# Patient Record
Sex: Female | Born: 1950 | Race: Black or African American | Hispanic: No | Smoking: Never smoker
Health system: Southern US, Community
[De-identification: ages and names within clinical notes are randomized; demographics above are authoritative.]

---

## 1997-12-20 ENCOUNTER — Other Ambulatory Visit: Admission: RE | Admit: 1997-12-20 | Discharge: 1997-12-20 | Payer: Self-pay | Admitting: Obstetrics and Gynecology

## 1999-09-16 ENCOUNTER — Other Ambulatory Visit: Admission: RE | Admit: 1999-09-16 | Discharge: 1999-09-16 | Payer: Self-pay | Admitting: Obstetrics and Gynecology

## 1999-09-16 ENCOUNTER — Encounter: Payer: Self-pay | Admitting: Obstetrics and Gynecology

## 1999-09-16 ENCOUNTER — Ambulatory Visit (HOSPITAL_COMMUNITY): Admission: RE | Admit: 1999-09-16 | Discharge: 1999-09-16 | Payer: Self-pay | Admitting: Obstetrics and Gynecology

## 2000-04-03 ENCOUNTER — Emergency Department (HOSPITAL_COMMUNITY): Admission: EM | Admit: 2000-04-03 | Discharge: 2000-04-03 | Payer: Self-pay | Admitting: Emergency Medicine

## 2001-04-20 ENCOUNTER — Encounter: Payer: Self-pay | Admitting: Obstetrics and Gynecology

## 2001-04-20 ENCOUNTER — Ambulatory Visit (HOSPITAL_COMMUNITY): Admission: RE | Admit: 2001-04-20 | Discharge: 2001-04-20 | Payer: Self-pay | Admitting: Obstetrics and Gynecology

## 2004-08-05 ENCOUNTER — Other Ambulatory Visit: Admission: RE | Admit: 2004-08-05 | Discharge: 2004-08-05 | Payer: Self-pay | Admitting: Obstetrics and Gynecology

## 2009-06-12 ENCOUNTER — Ambulatory Visit (HOSPITAL_COMMUNITY): Admission: RE | Admit: 2009-06-12 | Discharge: 2009-06-12 | Payer: Self-pay | Admitting: Obstetrics and Gynecology

## 2020-04-20 ENCOUNTER — Encounter (HOSPITAL_COMMUNITY): Payer: Self-pay | Admitting: Student

## 2020-04-20 ENCOUNTER — Emergency Department (HOSPITAL_COMMUNITY): Payer: Medicare HMO

## 2020-04-20 ENCOUNTER — Inpatient Hospital Stay (HOSPITAL_COMMUNITY)
Admission: EM | Admit: 2020-04-20 | Discharge: 2020-04-25 | DRG: 177 | Disposition: A | Discharge status: Skilled Nursing Facility | Payer: Medicare HMO | Attending: Internal Medicine | Admitting: Internal Medicine

## 2020-04-20 ENCOUNTER — Other Ambulatory Visit: Payer: Self-pay

## 2020-04-20 DIAGNOSIS — R748 Abnormal levels of other serum enzymes: Secondary | ICD-10-CM

## 2020-04-20 DIAGNOSIS — Z789 Other specified health status: Secondary | ICD-10-CM

## 2020-04-20 DIAGNOSIS — N179 Acute kidney failure, unspecified: Secondary | ICD-10-CM | POA: Diagnosis present

## 2020-04-20 DIAGNOSIS — I10 Essential (primary) hypertension: Secondary | ICD-10-CM | POA: Diagnosis present

## 2020-04-20 DIAGNOSIS — U071 COVID-19: Secondary | ICD-10-CM | POA: Diagnosis present

## 2020-04-20 DIAGNOSIS — J9601 Acute respiratory failure with hypoxia: Secondary | ICD-10-CM | POA: Diagnosis present

## 2020-04-20 DIAGNOSIS — I160 Hypertensive urgency: Secondary | ICD-10-CM | POA: Diagnosis present

## 2020-04-20 DIAGNOSIS — J1282 Pneumonia due to coronavirus disease 2019: Secondary | ICD-10-CM | POA: Diagnosis present

## 2020-04-20 DIAGNOSIS — R7989 Other specified abnormal findings of blood chemistry: Secondary | ICD-10-CM | POA: Diagnosis present

## 2020-04-20 LAB — COMPREHENSIVE METABOLIC PANEL
ALT: 123 U/L — ABNORMAL HIGH (ref 0–44)
AST: 102 U/L — ABNORMAL HIGH (ref 15–41)
Albumin: 3.4 g/dL — ABNORMAL LOW (ref 3.5–5.0)
Alkaline Phosphatase: 80 U/L (ref 38–126)
Anion gap: 12 (ref 5–15)
BUN: 23 mg/dL (ref 8–23)
CO2: 26 mmol/L (ref 22–32)
Calcium: 9 mg/dL (ref 8.9–10.3)
Chloride: 103 mmol/L (ref 98–111)
Creatinine, Ser: 0.91 mg/dL (ref 0.44–1.00)
GFR calc Af Amer: >60 mL/min (ref >60)
GFR calc non Af Amer: >60 mL/min (ref >60)
Glucose, Bld: 118 mg/dL — ABNORMAL HIGH (ref 70–99)
Potassium: 3.7 mmol/L (ref 3.5–5.1)
Sodium: 141 mmol/L (ref 135–145)
Total Bilirubin: 0.9 mg/dL (ref 0.3–1.2)
Total Protein: 7.9 g/dL (ref 6.5–8.1)

## 2020-04-20 LAB — CBC WITH DIFFERENTIAL/PLATELET
Abs Immature Granulocytes: 0.2 10*3/uL — ABNORMAL HIGH (ref 0.00–0.07)
Basophils Absolute: 0 10*3/uL (ref 0.0–0.1)
Basophils Relative: 0 %
Eosinophils Absolute: 0 10*3/uL (ref 0.0–0.5)
Eosinophils Relative: 0 %
HCT: 43.6 % (ref 36.0–46.0)
Hemoglobin: 13.9 g/dL (ref 12.0–15.0)
Immature Granulocytes: 2 %
Lymphocytes Relative: 7 %
Lymphs Abs: 0.7 10*3/uL (ref 0.7–4.0)
MCH: 30.3 pg (ref 26.0–34.0)
MCHC: 31.9 g/dL (ref 30.0–36.0)
MCV: 95.2 fL (ref 80.0–100.0)
Monocytes Absolute: 0.4 10*3/uL (ref 0.1–1.0)
Monocytes Relative: 4 %
Neutro Abs: 8.1 10*3/uL — ABNORMAL HIGH (ref 1.7–7.7)
Neutrophils Relative %: 87 %
Platelets: 224 10*3/uL (ref 150–400)
RBC: 4.58 MIL/uL (ref 3.87–5.11)
RDW: 13.9 % (ref 11.5–15.5)
WBC: 9.4 10*3/uL (ref 4.0–10.5)
nRBC: 0 % (ref 0.0–0.2)

## 2020-04-20 LAB — CREATININE, SERUM
Creatinine, Ser: 0.77 mg/dL (ref 0.44–1.00)
GFR calc Af Amer: >60 mL/min (ref >60)
GFR calc non Af Amer: >60 mL/min (ref >60)

## 2020-04-20 LAB — TRIGLYCERIDES: Triglycerides: 77 mg/dL (ref <150)

## 2020-04-20 LAB — ABO/RH: ABO/RH(D): A POS

## 2020-04-20 LAB — C-REACTIVE PROTEIN: CRP: 16.2 mg/dL — ABNORMAL HIGH (ref <1.0)

## 2020-04-20 LAB — SARS CORONAVIRUS 2 BY RT PCR (HOSPITAL ORDER, PERFORMED IN ~~LOC~~ HOSPITAL LAB): SARS Coronavirus 2: POSITIVE — AB

## 2020-04-20 LAB — D-DIMER, QUANTITATIVE: D-Dimer, Quant: 2.28 ug/mL-FEU — ABNORMAL HIGH (ref 0.00–0.50)

## 2020-04-20 LAB — PROCALCITONIN: Procalcitonin: <0.1 ng/mL

## 2020-04-20 LAB — LACTATE DEHYDROGENASE: LDH: 310 U/L — ABNORMAL HIGH (ref 98–192)

## 2020-04-20 LAB — LACTIC ACID, PLASMA: Lactic Acid, Venous: 1.3 mmol/L (ref 0.5–1.9)

## 2020-04-20 LAB — FERRITIN: Ferritin: 691 ng/mL — ABNORMAL HIGH (ref 11–307)

## 2020-04-20 LAB — FIBRINOGEN: Fibrinogen: >800 mg/dL — ABNORMAL HIGH (ref 210–475)

## 2020-04-20 LAB — HIV ANTIBODY (ROUTINE TESTING W REFLEX): HIV Screen 4th Generation wRfx: NONREACTIVE

## 2020-04-20 MED ORDER — HYDRALAZINE HCL 20 MG/ML IJ SOLN
5.0000 mg | INTRAMUSCULAR | Status: DC | PRN
  Administered 2020-04-21: 5 mg via INTRAVENOUS
  Filled 2020-04-20 (×2): qty 1

## 2020-04-20 MED ORDER — SODIUM CHLORIDE 0.9 % IV SOLN: 100.0000 mg | Freq: Every day | INTRAVENOUS | Status: DC

## 2020-04-20 MED ORDER — SODIUM CHLORIDE 0.9 % IV SOLN
100.0000 mg | Freq: Every day | INTRAVENOUS | Status: AC
  Administered 2020-04-21 – 2020-04-24 (×4): 100 mg via INTRAVENOUS
  Filled 2020-04-20 (×4): qty 20

## 2020-04-20 MED ORDER — DEXAMETHASONE SODIUM PHOSPHATE 10 MG/ML IJ SOLN
6.0000 mg | Freq: Once | INTRAMUSCULAR | Status: AC
  Administered 2020-04-20: 6 mg via INTRAVENOUS
  Filled 2020-04-20: qty 1

## 2020-04-20 MED ORDER — SODIUM CHLORIDE 0.9 % IV SOLN
200.0000 mg | Freq: Once | INTRAVENOUS | Status: DC
  Filled 2020-04-20: qty 40

## 2020-04-20 MED ORDER — METOPROLOL TARTRATE 25 MG PO TABS: 25.0000 mg | ORAL_TABLET | Freq: Two times a day (BID) | ORAL | Status: DC

## 2020-04-20 MED ORDER — ALBUTEROL SULFATE HFA 108 (90 BASE) MCG/ACT IN AERS
2.0000 | INHALATION_SPRAY | Freq: Four times a day (QID) | RESPIRATORY_TRACT | Status: DC
  Administered 2020-04-20 – 2020-04-25 (×20): 2 via RESPIRATORY_TRACT
  Filled 2020-04-20 (×2): qty 6.7

## 2020-04-20 MED ORDER — GUAIFENESIN-DM 100-10 MG/5ML PO SYRP
10.0000 mL | ORAL_SOLUTION | ORAL | Status: DC | PRN
  Administered 2020-04-21 – 2020-04-25 (×7): 10 mL via ORAL
  Filled 2020-04-20 (×7): qty 10

## 2020-04-20 MED ORDER — DEXAMETHASONE SODIUM PHOSPHATE 10 MG/ML IJ SOLN
6.0000 mg | Freq: Every day | INTRAMUSCULAR | Status: DC
  Filled 2020-04-20: qty 1

## 2020-04-20 MED ORDER — METOPROLOL TARTRATE 25 MG PO TABS
25.0000 mg | ORAL_TABLET | Freq: Two times a day (BID) | ORAL | Status: DC
  Administered 2020-04-20: 25 mg via ORAL
  Filled 2020-04-20 (×2): qty 1

## 2020-04-20 MED ORDER — ACETAMINOPHEN 650 MG RE SUPP: 650.0000 mg | Freq: Four times a day (QID) | RECTAL | Status: DC | PRN

## 2020-04-20 MED ORDER — ACETAMINOPHEN 325 MG PO TABS
650.0000 mg | ORAL_TABLET | Freq: Four times a day (QID) | ORAL | Status: DC | PRN
  Administered 2020-04-22: 650 mg via ORAL
  Filled 2020-04-20: qty 2

## 2020-04-20 MED ORDER — HYDROCOD POLST-CPM POLST ER 10-8 MG/5ML PO SUER
5.0000 mL | Freq: Two times a day (BID) | ORAL | Status: DC | PRN
  Administered 2020-04-22 – 2020-04-24 (×3): 5 mL via ORAL
  Filled 2020-04-20 (×3): qty 5

## 2020-04-20 MED ORDER — SODIUM CHLORIDE 0.9 % IV SOLN
200.0000 mg | Freq: Once | INTRAVENOUS | Status: AC
  Administered 2020-04-20: 200 mg via INTRAVENOUS
  Filled 2020-04-20: qty 200

## 2020-04-20 MED ORDER — LISINOPRIL 10 MG PO TABS
5.0000 mg | ORAL_TABLET | Freq: Every day | ORAL | Status: DC
  Administered 2020-04-20: 5 mg via ORAL
  Filled 2020-04-20: qty 1

## 2020-04-20 MED ORDER — ENOXAPARIN SODIUM 40 MG/0.4ML ~~LOC~~ SOLN
40.0000 mg | SUBCUTANEOUS | Status: DC
  Administered 2020-04-20 – 2020-04-24 (×5): 40 mg via SUBCUTANEOUS
  Filled 2020-04-20 (×5): qty 0.4

## 2020-04-20 MED ORDER — ASCORBIC ACID 500 MG PO TABS
500.0000 mg | ORAL_TABLET | Freq: Every day | ORAL | Status: DC
  Administered 2020-04-20 – 2020-04-25 (×6): 500 mg via ORAL
  Filled 2020-04-20 (×5): qty 1

## 2020-04-20 MED ORDER — ZINC SULFATE 220 (50 ZN) MG PO CAPS
220.0000 mg | ORAL_CAPSULE | Freq: Every day | ORAL | Status: DC
  Administered 2020-04-20 – 2020-04-25 (×6): 220 mg via ORAL
  Filled 2020-04-20 (×6): qty 1

## 2020-04-20 NOTE — ED Notes (Signed)
Called main lab to confirm they could run admission lab orders off blood specimens already in lab. Confirmed they could. Just need to send down pink top tube.

## 2020-04-20 NOTE — ED Provider Notes (Signed)
[facility]-EMERGENCY DEPT Provider Note   CSN: [id number] Arrival date & time: 04/20/20  0827     History Chief Complaint  Patient presents with  . Shortness of Breath    Molly Grant is a 69 y.o. female without significant past medical history who presents to the ED with complaints of dyspnea x 1 week. Patient reports dyspnea is constant, worse with activity, no alleviating factors. Has associated dry cough which has become productive of clear sputum & fatigue. Worsening dyspnea prompted ED visit. Denies fever, chills, chest pain, leg pain/swelling, hemoptysis, recent surgery/trauma, recent long travel, hormone use, personal hx of cancer, or hx of DVT/PE. She has not been vaccinated against COVID 19. Works from home. No recent known COVID exposures. Per EMS hypoxic in the 80s on RA.    HPI     History reviewed. No pertinent past medical history.  There are no problems to display for this patient.   History reviewed. No pertinent surgical history.   OB History   No obstetric history on file.     History reviewed. No pertinent family history.  Social History   Tobacco Use  . Smoking status: Never Smoker  . Smokeless tobacco: Never Used  Substance Use Topics  . Alcohol use: Not on file  . Drug use: Not on file    Home Medications Prior to Admission medications   Not on File    Allergies    Patient has no known allergies.  Review of Systems   Review of Systems  Constitutional: Positive for fatigue. Negative for chills and fever.  HENT: Negative for congestion, ear pain and sore throat.   Respiratory: Positive for cough and shortness of breath. Negative for wheezing.   Cardiovascular: Negative for chest pain.  Gastrointestinal: Negative for abdominal pain, diarrhea, nausea and vomiting.  Neurological: Negative for syncope.  All other systems reviewed and are negative.  Physical Exam Updated Vital Signs BP (!) 189/105 (BP Location:  Right Arm)   Pulse (!) 102   Temp 98.4 F (36.9 C) (Oral)   Resp (!) 33   Ht 5\' 2"  (1.575 m)   SpO2 97%   Physical Exam Vitals and nursing note reviewed.  Constitutional:      General: She is not in acute distress.    Appearance: She is well-developed. She is ill-appearing. She is not toxic-appearing.  HENT:     Head: Normocephalic and atraumatic.  Eyes:     General:        Right eye: No discharge.        Left eye: No discharge.     Conjunctiva/sclera: Conjunctivae normal.  Cardiovascular:     Rate and Rhythm: Normal rate and regular rhythm.  Pulmonary:     Effort: Tachypnea present. No respiratory distress.     Breath sounds: No wheezing, rhonchi or rales.     Comments: Mildly tachypneic. SpO2 in the upper 80s on RA, improved with application of 2L via [place].  Abdominal:     General: There is no distension.     Palpations: Abdomen is soft.     Tenderness: There is no abdominal tenderness.  Musculoskeletal:     Cervical back: Neck supple.     Comments: Trace symmetric pitting edema to the bilateral lower legs. No calf tenderness or overlying erythema.   Skin:    General: Skin is warm and dry.     Findings: No rash.  Neurological:     Mental Status: She is alert.  Comments: Clear speech.   Psychiatric:        Behavior: Behavior normal.     ED Results / Procedures / Treatments   Labs (all labs ordered are listed, but only abnormal results are displayed) Labs Reviewed  SARS CORONAVIRUS 2 BY RT PCR (HOSPITAL ORDER, PERFORMED IN [facility]) - Abnormal; Notable for the following components:      Result Value   SARS Coronavirus 2 POSITIVE (*)    All other components within normal limits  CBC WITH DIFFERENTIAL/PLATELET - Abnormal; Notable for the following components:   Neutro Abs 8.1 (*)    Abs Immature Granulocytes 0.20 (*)    All other components within normal limits  COMPREHENSIVE METABOLIC PANEL - Abnormal; Notable for the following components:    Glucose, Bld 118 (*)    Albumin 3.4 (*)    AST 102 (*)    ALT 123 (*)    All other components within normal limits  CULTURE, BLOOD (ROUTINE X 2)  CULTURE, BLOOD (ROUTINE X 2)  LACTIC ACID, PLASMA  LACTIC ACID, PLASMA  D-DIMER, QUANTITATIVE (NOT AT [facility])  PROCALCITONIN  LACTATE DEHYDROGENASE  FERRITIN  TRIGLYCERIDES  FIBRINOGEN  C-REACTIVE PROTEIN    EKG None  Radiology DG Chest Portable 1 View  Result Date: 04/20/2020 CLINICAL DATA:  Shortness of breath.  Cough. EXAM: PORTABLE CHEST 1 VIEW COMPARISON:  None. FINDINGS: Bibasilar infiltrates are identified. The heart, hila, mediastinum, lungs, and pleura are otherwise unremarkable. IMPRESSION: Bibasilar infiltrates concerning for multifocal pneumonia. Recommend clinical correlation and follow-up to resolution. Electronically Signed   By: Gerome Sam III M.D   On: 04/20/2020 11:22    Procedures .Critical Care Performed by: Cherly Anderson, PA-C Authorized by: Cherly Anderson, PA-C     CRITICAL CARE Performed by: Harvie Heck   Total critical care time: 35 minutes  Critical care time was exclusive of separately billable procedures and treating other patients.  Critical care was necessary to treat or prevent imminent or life-threatening deterioration.  Critical care was time spent personally by me on the following activities: development of treatment plan with patient and/or surrogate as well as nursing, discussions with consultants, evaluation of patient's response to treatment, examination of patient, obtaining history from patient or surrogate, ordering and performing treatments and interventions, ordering and review of laboratory studies, ordering and review of radiographic studies, pulse oximetry and re-evaluation of patient's condition.    (including critical care time)  Medications Ordered in ED Medications  remdesivir 200 mg in sodium chloride 0.9% 250 mL IVPB (200 mg Intravenous New  Bag/Given 04/20/20 1329)    Followed by  remdesivir 100 mg in sodium chloride 0.9 % 100 mL IVPB (has no administration in time range)  dexamethasone (DECADRON) injection 6 mg (6 mg Intravenous Given 04/20/20 1325)    ED Course  I have reviewed the triage vital signs and the nursing notes.  Pertinent labs & imaging results that were available during my care of the patient were reviewed by me and considered in my medical decision making (see chart for details).    Cam Harnden Thornhill was evaluated in Emergency Department on 04/20/2020 for the symptoms described in the history of present illness. He/she was evaluated in the context of the global COVID-19 pandemic, which necessitated consideration that the patient might be at risk for infection with the SARS-CoV-2 virus that causes COVID-19. Institutional protocols and algorithms that pertain to the evaluation of patients at risk for COVID-19 are in a state of  rapid change based on information released by regulatory bodies including the CDC and federal and state organizations. These policies and algorithms were followed during the patient's care in the ED.  MDM Rules/Calculators/A&P                          Patient presents to the ED with complaints of dyspnea x 1 week. Patient is nontoxic, does appear somewhat ill, noted to be hypertensive & hypoxic.   Ddx: CAP, COVID 19, PE, CHF, bronchitis, arrhythmia, critical anemia.   Additional history obtained:  Additional history obtained from EMS.  EKG: Sinus tachycardia, no STEMI. Lab Tests:  I Ordered, reviewed, and interpreted labs, which included:  CBC: Unremarkable CMP: Transaminitis, otherwise unremarkable. Covid testing: Positive--> Inflammatory markers ordered. Imaging Studies ordered:  I ordered imaging studies which included chest x-ray, I independently visualized and interpreted imaging which showed multifocal pneumonia.  Patient with acute hypoxic respiratory failure in the setting of  COVID-19.  We will start Decadron and remdesivir with consult placed to the hospitalist service.  13:40: CONSULT: Discussed case with hospitalist Dr Rhona Leavens- accepts admission.   Findings and plan of care discussed with supervising physician Dr. Dalene Seltzer who is in agreement.   Portions of this note were generated with Scientist, clinical (histocompatibility and immunogenetics). Dictation errors may occur despite best attempts at proofreading.  Final Clinical Impression(s) / ED Diagnoses Final diagnoses:  COVID-19  Acute hypoxemic respiratory failure [facility])    Rx / DC Orders ED Discharge Orders    None       Cherly Anderson, PA-C 04/20/20 1342    Alvira Monday, MD 04/22/20 1039

## 2020-04-20 NOTE — H&P (Signed)
History and Physical    Molly Grant JQB:[id number] DOB: March 21, 1951 DOA: 04/20/2020  PCP: Patient, No Pcp Per  Patient coming from: Home  Chief Complaint: Shortness of breath  HPI: Molly Grant is a 69 y.o. female with no known significant past medical history who presents with complaints of shortness of breath with cough for 1 week prior to this hospital visit.  Patient denies fevers chills.  Patient denies any known sick contacts.  Given worsening shortness of breath, EMS was called.  Upon EMS arrival, patient was noted to have O2 saturation in the 80s on room air.  Patient is not vaccinated against COVID-19  ED Course: In the emergency department, patient was found to have bilateral infiltrates on chest x-ray, reviewed.  Covid test was confirmed positive.  Inflammatory markers were later noted to be elevated, with CRP elevated at over 16.  Procalcitonin less than 0.1.  LDH 310.  Patient was given 1 dose of IV steroid and started on remdesivir.  Hospitalist service consulted for consideration for medical admission  Review of Systems:  Review of Systems  Constitutional: Positive for fever and malaise/fatigue.  HENT: Positive for congestion. Negative for ear pain.   Eyes: Negative for double vision and pain.  Respiratory: Positive for cough, sputum production and shortness of breath.   Cardiovascular: Negative for palpitations, orthopnea and claudication.  Gastrointestinal: Negative for abdominal pain and constipation.  Genitourinary: Negative for frequency and urgency.  Musculoskeletal: Negative for back pain, joint pain and neck pain.  Neurological: Negative for sensory change, speech change, seizures and loss of consciousness.  Psychiatric/Behavioral: Negative for hallucinations and memory loss. The patient is not nervous/anxious.     History reviewed. No pertinent past medical history.  History reviewed. No pertinent surgical history.   reports that she has never smoked. She  has never used smokeless tobacco. No history on file for alcohol use and drug use.  No Known Allergies  Family history When asked directly, patient denies any family medical problems  Prior to Admission medications   Not on File    Physical Exam: Vitals:   04/20/20 1200 04/20/20 1230 04/20/20 1300 04/20/20 1330  BP: (!) 175/100 (!) 172/98 (!) 174/93 (!) 182/100  Pulse: 90 92 77 90  Resp: (!) 25 (!) 29 (!) 26 (!) 24  Temp:      TempSrc:      SpO2: 92% 93% 95% 95%  Height:        Constitutional: NAD, calm, comfortable Vitals:   04/20/20 1200 04/20/20 1230 04/20/20 1300 04/20/20 1330  BP: (!) 175/100 (!) 172/98 (!) 174/93 (!) 182/100  Pulse: 90 92 77 90  Resp: (!) 25 (!) 29 (!) 26 (!) 24  Temp:      TempSrc:      SpO2: 92% 93% 95% 95%  Height:       Eyes: PERRL, lids and conjunctivae normal ENMT: Mucous membranes are moist. Posterior pharynx clear of any exudate or lesions.Normal dentition.  Neck: normal, supple, no masses, no thyromegaly Respiratory: clear to auscultation bilaterally, no wheezing, no crackles. Normal respiratory effort. No accessory muscle use.  Cardiovascular: Regular rate and rhythm,s1, s2 Abdomen: no tenderness, no masses palpated. No hepatosplenomegaly. Bowel sounds positive.  Musculoskeletal: no clubbing / cyanosis. No joint deformity upper and lower extremities. Good ROM, no contractures. Normal muscle tone.  Skin: no rashes, lesions, ulcers. No induration Neurologic: CN 2-12 grossly intact. Sensation intact, no tremors Psychiatric: Normal judgment and insight. Alert and oriented x 3. Normal  mood.    Labs on Admission: I have personally reviewed following labs and imaging studies  CBC: Recent Labs  Lab 04/20/20 1015  WBC 9.4  NEUTROABS 8.1*  HGB 13.9  HCT 43.6  MCV 95.2  PLT 224   Basic Metabolic Panel: Recent Labs  Lab 04/20/20 1015  NA 141  K 3.7  CL 103  CO2 26  GLUCOSE 118*  BUN 23  CREATININE 0.91  CALCIUM 9.0    GFR: CrCl cannot be calculated (Unknown ideal weight.). Liver Function Tests: Recent Labs  Lab 04/20/20 1015  AST 102*  ALT 123*  ALKPHOS 80  BILITOT 0.9  PROT 7.9  ALBUMIN 3.4*   No results for input(s): LIPASE, AMYLASE in the last 168 hours. No results for input(s): AMMONIA in the last 168 hours. Coagulation Profile: No results for input(s): INR, PROTIME in the last 168 hours. Cardiac Enzymes: No results for input(s): CKTOTAL, CKMB, CKMBINDEX, TROPONINI in the last 168 hours. BNP (last 3 results) No results for input(s): PROBNP in the last 8760 hours. HbA1C: No results for input(s): HGBA1C in the last 72 hours. CBG: No results for input(s): GLUCAP in the last 168 hours. Lipid Profile: Recent Labs    04/20/20 1230  TRIG 77   Thyroid Function Tests: No results for input(s): TSH, T4TOTAL, FREET4, T3FREE, THYROIDAB in the last 72 hours. Anemia Panel: Recent Labs    04/20/20 1230  FERRITIN 691*   Urine analysis: No results found for: COLORURINE, APPEARANCEUR, LABSPEC, PHURINE, GLUCOSEU, HGBUR, BILIRUBINUR, KETONESUR, PROTEINUR, UROBILINOGEN, NITRITE, LEUKOCYTESUR Sepsis Labs: !!!!!!!!!!!!!!!!!!!!!!!!!!!!!!!!!!!!!!!!!!!! @LABRCNTIP (procalcitonin:4,lacticidven:4) ) Recent Results (from the past 240 hour(s))  SARS Coronavirus 2 by RT PCR (hospital order, performed in [facility] hospital lab) Nasopharyngeal Nasopharyngeal Swab     Status: Abnormal   Collection Time: 04/20/20 10:15 AM   Specimen: Nasopharyngeal Swab  Result Value Ref Range Status   SARS Coronavirus 2 POSITIVE (A) NEGATIVE Final    Comment: RESULT CALLED TO, READ BACK BY AND VERIFIED WITH: PETRUCELL,S PA @1204  ON 04/20/20 JACKSON,K (NOTE) SARS-CoV-2 target nucleic acids are DETECTED  SARS-CoV-2 RNA is generally detectable in upper respiratory specimens  during the acute phase of infection.  Positive results are indicative  of the presence of the identified virus, but do not rule out bacterial  infection or co-infection with other pathogens not detected by the test.  Clinical correlation with patient history and  other diagnostic information is necessary to determine patient infection status.  The expected result is negative.  Fact Sheet for Patients:     Fact Sheet for Healthcare Providers:   04/22/20    This test is not yet approved or cleared by the [employer] FDA and  has been authorized for detection and/or diagnosis of SARS-CoV-2 by FDA under an Emergency Use Authorization (EUA).  This EUA will remain in effect (meaning  this test can be used) for the duration of  the COVID-19 declaration under Section 564(b)(1) of the Act, 21 U.S.C. section 360-bbb-3(b)(1), unless the authorization is terminated or revoked sooner.  Performed at [facility], [address], [place], [place]      Radiological Exams on Admission: DG Chest Portable 1 View  Result Date: 04/20/2020 CLINICAL DATA:  Shortness of breath.  Cough. EXAM: PORTABLE CHEST 1 VIEW COMPARISON:  None. FINDINGS: Bibasilar infiltrates are identified. The heart, hila, mediastinum, lungs, and pleura are otherwise unremarkable. IMPRESSION: Bibasilar infiltrates concerning for multifocal pneumonia. Recommend clinical correlation and follow-up to resolution. Electronically Signed  By: Gerome Sam III M.D   On: 04/20/2020 11:22    EKG: Independently reviewed. Sinus  Assessment/Plan Active Problems:   Pneumonia due to COVID-19 virus   Hypertensive urgency   Elevated LFTs   1. Pneumonia secondary to COVID-19 1. Stents with acute hypoxemic failure currently requiring 2 L nasal cannula 2. Chest x-ray reviewed, findings of bibasilar infiltrates concerning for multifocal pneumonia 3. Patient is confirmed Covid positive 4. Inflammatory markers elevated with CRP in excess of 16 5. LFTs are elevated with AST and  ALT just over 100 6. We will continue patient with remdesivir and IV steroids 7. We will continue patient with vitamin C and zinc 8. Wean O2 as tolerated 9. Serial inflammatory markers 2. Hypertensive of urgency 1. Patient with systolic blood pressure just under 200, not on blood pressure medications prior to admission 2. We will start patient on low-dose metoprolol and lisinopril for stage II hypertension, titrate regimen as tolerated 3. We will add as needed hydralazine for systolic blood pressure greater than 160 3. Elevated LFTs 1. Suspect secondary to presenting COVID-19 infection 2. Plan per above, repeat LFTs in morning  DVT prophylaxis: Lovenox  Code Status: Full Family Communication: Pt in room  Disposition Plan:   Consults called:  Admission status: Patient is able likely require greater than 2 midnight stay to treat COVID-19 pneumonia in setting of acute hypoxemic failure  Rickey Barbara MD [facility] Pager On Amion  If 7PM-7AM, please contact night-coverage  04/20/2020, 2:08 PM

## 2020-04-20 NOTE — ED Notes (Signed)
PA student at bedside.

## 2020-04-20 NOTE — ED Triage Notes (Signed)
Per [facility] pt comes from home for SOB,  Cough that was dry but now getting up phlegm, fatigue for a week. Denies being exposed to Covid.  O2 initially in 80s on room air at home. Pt O2 went up to 96% on 4L.

## 2020-04-21 ENCOUNTER — Inpatient Hospital Stay (HOSPITAL_COMMUNITY): Payer: Medicare HMO

## 2020-04-21 ENCOUNTER — Encounter (HOSPITAL_COMMUNITY): Payer: Self-pay | Admitting: Internal Medicine

## 2020-04-21 DIAGNOSIS — J1282 Pneumonia due to coronavirus disease 2019: Secondary | ICD-10-CM

## 2020-04-21 LAB — LIPID PANEL
Cholesterol: 181 mg/dL (ref 0–200)
HDL: 27 mg/dL — ABNORMAL LOW (ref >40)
LDL Cholesterol: 143 mg/dL — ABNORMAL HIGH (ref 0–99)
Total CHOL/HDL Ratio: 6.7 RATIO
Triglycerides: 53 mg/dL (ref <150)
VLDL: 11 mg/dL (ref 0–40)

## 2020-04-21 LAB — CBC WITH DIFFERENTIAL/PLATELET
Abs Immature Granulocytes: 0.1 10*3/uL — ABNORMAL HIGH (ref 0.00–0.07)
Basophils Absolute: 0 10*3/uL (ref 0.0–0.1)
Basophils Relative: 0 %
Eosinophils Absolute: 0 10*3/uL (ref 0.0–0.5)
Eosinophils Relative: 0 %
HCT: 46.4 % — ABNORMAL HIGH (ref 36.0–46.0)
Hemoglobin: 14.7 g/dL (ref 12.0–15.0)
Immature Granulocytes: 1 %
Lymphocytes Relative: 8 %
Lymphs Abs: 0.6 10*3/uL — ABNORMAL LOW (ref 0.7–4.0)
MCH: 30.8 pg (ref 26.0–34.0)
MCHC: 31.7 g/dL (ref 30.0–36.0)
MCV: 97.1 fL (ref 80.0–100.0)
Monocytes Absolute: 0.3 10*3/uL (ref 0.1–1.0)
Monocytes Relative: 4 %
Neutro Abs: 6.1 10*3/uL (ref 1.7–7.7)
Neutrophils Relative %: 87 %
Platelets: 250 10*3/uL (ref 150–400)
RBC: 4.78 MIL/uL (ref 3.87–5.11)
RDW: 14.1 % (ref 11.5–15.5)
WBC: 7 10*3/uL (ref 4.0–10.5)
nRBC: 0 % (ref 0.0–0.2)

## 2020-04-21 LAB — COMPREHENSIVE METABOLIC PANEL
ALT: 149 U/L — ABNORMAL HIGH (ref 0–44)
AST: 120 U/L — ABNORMAL HIGH (ref 15–41)
Albumin: 3.3 g/dL — ABNORMAL LOW (ref 3.5–5.0)
Alkaline Phosphatase: 86 U/L (ref 38–126)
Anion gap: 12 (ref 5–15)
BUN: 25 mg/dL — ABNORMAL HIGH (ref 8–23)
CO2: 25 mmol/L (ref 22–32)
Calcium: 9.1 mg/dL (ref 8.9–10.3)
Chloride: 104 mmol/L (ref 98–111)
Creatinine, Ser: 0.71 mg/dL (ref 0.44–1.00)
GFR calc Af Amer: >60 mL/min (ref >60)
GFR calc non Af Amer: >60 mL/min (ref >60)
Glucose, Bld: 124 mg/dL — ABNORMAL HIGH (ref 70–99)
Potassium: 3.9 mmol/L (ref 3.5–5.1)
Sodium: 141 mmol/L (ref 135–145)
Total Bilirubin: 0.6 mg/dL (ref 0.3–1.2)
Total Protein: 8.1 g/dL (ref 6.5–8.1)

## 2020-04-21 LAB — FERRITIN: Ferritin: 758 ng/mL — ABNORMAL HIGH (ref 11–307)

## 2020-04-21 LAB — HEMOGLOBIN A1C
Hgb A1c MFr Bld: 6 % — ABNORMAL HIGH (ref 4.8–5.6)
Mean Plasma Glucose: 125.5 mg/dL

## 2020-04-21 LAB — HEPATITIS PANEL, ACUTE
HCV Ab: NONREACTIVE
Hep A IgM: NONREACTIVE
Hep B C IgM: NONREACTIVE
Hepatitis B Surface Ag: NONREACTIVE

## 2020-04-21 LAB — C-REACTIVE PROTEIN: CRP: 14.3 mg/dL — ABNORMAL HIGH (ref <1.0)

## 2020-04-21 LAB — D-DIMER, QUANTITATIVE: D-Dimer, Quant: 3.62 ug/mL-FEU — ABNORMAL HIGH (ref 0.00–0.50)

## 2020-04-21 MED ORDER — METHYLPREDNISOLONE SODIUM SUCC 125 MG IJ SOLR
60.0000 mg | Freq: Two times a day (BID) | INTRAMUSCULAR | Status: DC
  Administered 2020-04-21 – 2020-04-25 (×9): 60 mg via INTRAVENOUS
  Filled 2020-04-21 (×9): qty 2

## 2020-04-21 MED ORDER — PANTOPRAZOLE SODIUM 40 MG IV SOLR
40.0000 mg | INTRAVENOUS | Status: DC
  Administered 2020-04-21: 40 mg via INTRAVENOUS
  Filled 2020-04-21: qty 40

## 2020-04-21 MED ORDER — PANTOPRAZOLE SODIUM 40 MG PO TBEC
40.0000 mg | DELAYED_RELEASE_TABLET | Freq: Every day | ORAL | Status: DC
  Administered 2020-04-22 – 2020-04-25 (×4): 40 mg via ORAL
  Filled 2020-04-21 (×4): qty 1

## 2020-04-21 MED ORDER — AMLODIPINE BESYLATE 5 MG PO TABS
5.0000 mg | ORAL_TABLET | Freq: Every day | ORAL | Status: DC
  Administered 2020-04-21 – 2020-04-25 (×5): 5 mg via ORAL
  Filled 2020-04-21 (×5): qty 1

## 2020-04-21 MED ORDER — HYDROCHLOROTHIAZIDE 25 MG PO TABS
25.0000 mg | ORAL_TABLET | Freq: Every day | ORAL | Status: DC
  Administered 2020-04-21 – 2020-04-22 (×2): 25 mg via ORAL
  Filled 2020-04-21 (×2): qty 1

## 2020-04-21 NOTE — ED Notes (Signed)
Receiving nurse unable to take report, will call again later.

## 2020-04-21 NOTE — Progress Notes (Signed)

## 2020-04-21 NOTE — Progress Notes (Signed)
PROGRESS NOTE  Molly Grant  DOB: [birth year]  PCP: Patient, No Pcp Per [id number]  DOA: 04/20/2020  LOS: 1 day   Chief Complaint  Patient presents with  . Shortness of Breath    Brief narrative: Molly Grant is a 69 y.o. female with no known past medical history and not on meds at home. Patient presented to the ED on 04/20/2020 with complaint of 1 week history of shortness of breath and cough, progressively getting worse.  EMS noted oxygen saturation to be in 80s on room air and brought to the ED. Of note, patient is not vaccinated against COVID-19.  In the ED, patient was afebrile, heart rate 102, respiratory 33, oxygen saturation 87% on room air improved to more than 90% on 3 L by nasal cannula.  Blood pressure 189/105. COVID-19 PCR positive. Labs with normal CBC, normal procalcitonin, normal lactic acid, CRP elevated to 16.2, ferritin elevated to 691, LDH elevated to 310, D-dimer 2.28 BMP normal, AST/ALT elevated to 102/123. Chest x-ray showed bibasilar infiltrates. Patient was admitted to hospitalist service for further evaluation management.  Subjective: Patient was seen and examined this morning.  Elderly African-American female.  Propped up in bed.  Feels better than at presentation.  On 3 L oxygen by nasal cannula. Chart reviewed No fever.  Respiratory team 20s, remains on supplemental oxygen. Blood pressure overnight has remained elevated mostly over 180. Labs this morning with AST/ALT further elevated to 120/149, ferritin up to 758, CRP coming down, 14.3, D-dimer up 3.62.  Assessment/Plan: COVID pneumonia Acute respiratory failure with hypoxia  -Presented with progressively worsening shortness of breath, cough -COVID test: PCR positive on admission -Chest imaging: Chest x-ray admission showed bibasal infiltrates  -Treatment: Patient has been started on IV remdesivir, IV Solu-Medrol 60 mg twice daily.  Currently not requiring Actemra or baricitinib.   -Oxygen  - SpO2: 90 % O2 Flow Rate (L/min): 3 L/min  -Progression: Feels better than at presentation.  CRP slightly improving.  Remains on supplemental oxygen.  Respiratory status is stable.  Continue remdesivir and steroid as above.   -Protonix while on steroids. -Supportive care: Vitamin C, Zinc, PRN inhalers, Tylenol, Antitussives (benzonatate/ Mucinex/Tussionex).   -Encouraged incentive spirometry, out of bed and early mobilization as much as possible -Continue airborne/contact isolation precautions for duration of 3 weeks from the day of diagnosis. -WBC and inflammatory markers trend as below.  CRP improving, ferritin and D-dimer is up.  Lab Results  Component Value Date   SARSCOV2NAA POSITIVE (A) 04/20/2020    Recent Labs  Lab 04/20/20 1015 04/20/20 1228 04/20/20 1230 04/21/20 0500  WBC 9.4  --   --  7.0  LATICACIDVEN  --  1.3  --   --   PROCALCITON  --   --  <0.10  --   DDIMER  --   --  2.28* 3.62*  FERRITIN  --   --  691* 758*  LDH  --   --  310*  --   CRP  --   --  16.2* 14.3*  ALT 123*  --   --  149*   The treatment plan and use of medications and known side effects were discussed with patient/family. Some of the medications used are based on case reports/anecdotal data.  All other medications being used in the management of COVID-19 based on limited study data.  Complete risks and long-term side effects are unknown, however in the best clinical judgment they seem to be of some benefit.  Patient  wanted to proceed with treatment options provided.  Hypertensive of urgency -No known history of hypertension.  Blood pressure persistently elevated up to 200 last night. -I started the patient on amlodipine 5 mg daily and HCTZ 25 mg daily this morning. -IV hydralazine as needed. -Also obtain A1c and lipid panel for screening.  Elevated LFTs -Suspect secondary to Covid. ?Alcohol use -For completion of work-up, will obtain acute hypertension panel and right upper quadrant  ultrasound. -Continue to monitor labs. Recent Labs  Lab 04/20/20 1015 04/21/20 0500  AST 102* 120*  ALT 123* 149*  ALKPHOS 80 86  BILITOT 0.9 0.6  PROT 7.9 8.1  ALBUMIN 3.4* 3.3*     Mobility: Encourage ambulation Code Status:   Code Status: Full Code  Nutritional status: There is no height or weight on file to calculate BMI.     Diet Order            Diet regular Room service appropriate? Yes; Fluid consistency: Thin  Diet effective now                 DVT prophylaxis: enoxaparin (LOVENOX) injection 40 mg Start: 04/20/20 2200   Antimicrobials:  None Fluid: None  Consultants: None Family Communication:  None at bedside  Status is: Inpatient  Remains inpatient appropriate because:Ongoing diagnostic testing needed not appropriate for outpatient work up and IV treatments appropriate due to intensity of illness or inability to take PO   Dispo: The patient is from: Home              Anticipated d/c is to: Home              Anticipated d/c date is: 3 days              Patient currently is not medically stable to d/c.       Infusions:  . remdesivir 100 mg in NS 100 mL 100 mg (04/21/20 0910)    Scheduled Meds: . albuterol  2 puff Inhalation Q6H  . amLODipine  5 mg Oral Daily  . vitamin C  500 mg Oral Daily  . enoxaparin (LOVENOX) injection  40 mg Subcutaneous Q24H  . hydrochlorothiazide  25 mg Oral Daily  . methylPREDNISolone (SOLU-MEDROL) injection  60 mg Intravenous Q12H  . [START ON 04/22/2020] pantoprazole  40 mg Oral Daily  . zinc sulfate  220 mg Oral Daily    Antimicrobials: Anti-infectives (From admission, onward)   Start     Dose/Rate Route Frequency Ordered Stop   04/21/20 1000  remdesivir 100 mg in sodium chloride 0.9 % 100 mL IVPB  Status:  Discontinued       "Followed by" Linked Group Details   100 mg 200 mL/hr over 30 Minutes Intravenous Daily 04/20/20 1220 04/20/20 1233   04/21/20 1000  remdesivir 100 mg in sodium chloride 0.9 % 100 mL  IVPB       "Followed by" Linked Group Details   100 mg 200 mL/hr over 30 Minutes Intravenous Daily 04/20/20 1233 04/25/20 0959   04/20/20 1400  remdesivir 200 mg in sodium chloride 0.9% 250 mL IVPB  Status:  Discontinued       "Followed by" Linked Group Details   200 mg 580 mL/hr over 30 Minutes Intravenous Once 04/20/20 1220 04/20/20 1233   04/20/20 1300  remdesivir 200 mg in sodium chloride 0.9% 250 mL IVPB       "Followed by" Linked Group Details   200 mg 580 mL/hr over 30 Minutes Intravenous  Once 04/20/20 1233 04/20/20 1359      PRN meds: acetaminophen **OR** acetaminophen, chlorpheniramine-HYDROcodone, guaiFENesin-dextromethorphan, hydrALAZINE   Objective: Vitals:   04/21/20 1200 04/21/20 1230  BP: (!) 160/93 (!) 158/82  Pulse: (!) 103 91  Resp: 20 20  Temp:    SpO2: 92% 92%    Intake/Output Summary (Last 24 hours) at 04/21/2020 1255 Last data filed at 04/20/2020 1359 Gross per 24 hour  Intake 289.26 ml  Output --  Net 289.26 ml   There were no vitals filed for this visit. Weight change:  There is no height or weight on file to calculate BMI.   Physical Exam: General exam: Appears calm and comfortable.  Not in physical distress Skin: No rashes, lesions or ulcers. HEENT: Atraumatic, normocephalic, supple neck, no obvious bleeding Lungs: Clear to auscultation bilaterally CVS: Regular rate and rhythm with no murmur GI/Abd soft, nontender, nondistended, bowel sound present CNS: Alert, awake, oriented x3 Psychiatry: Mood appropriate Extremities: No pedal edema, no calf tenderness  Data Review: I have personally reviewed the laboratory data and studies available.  Recent Labs  Lab 04/20/20 1015 04/21/20 0500  WBC 9.4 7.0  NEUTROABS 8.1* 6.1  HGB 13.9 14.7  HCT 43.6 46.4*  MCV 95.2 97.1  PLT 224 250   Recent Labs  Lab 04/20/20 1015 04/20/20 1040 04/21/20 0500  NA 141  --  141  K 3.7  --  3.9  CL 103  --  104  CO2 26  --  25  GLUCOSE 118*  --  124*   BUN 23  --  25*  CREATININE 0.91 0.77 0.71  CALCIUM 9.0  --  9.1   No results found for: HGBA1C     Component Value Date/Time   TRIG 77 04/20/2020 1230    Signed, Lorin Glass, MD [facility] Pager: [phone] (Secure Chat preferred). 04/21/2020

## 2020-04-22 LAB — CBC WITH DIFFERENTIAL/PLATELET
Abs Immature Granulocytes: 0.11 10*3/uL — ABNORMAL HIGH (ref 0.00–0.07)
Basophils Absolute: 0 10*3/uL (ref 0.0–0.1)
Basophils Relative: 0 %
Eosinophils Absolute: 0 10*3/uL (ref 0.0–0.5)
Eosinophils Relative: 0 %
HCT: 44.5 % (ref 36.0–46.0)
Hemoglobin: 14.1 g/dL (ref 12.0–15.0)
Immature Granulocytes: 1 %
Lymphocytes Relative: 6 %
Lymphs Abs: 0.6 10*3/uL — ABNORMAL LOW (ref 0.7–4.0)
MCH: 30.7 pg (ref 26.0–34.0)
MCHC: 31.7 g/dL (ref 30.0–36.0)
MCV: 96.7 fL (ref 80.0–100.0)
Monocytes Absolute: 0.3 10*3/uL (ref 0.1–1.0)
Monocytes Relative: 3 %
Neutro Abs: 8.9 10*3/uL — ABNORMAL HIGH (ref 1.7–7.7)
Neutrophils Relative %: 90 %
Platelets: 334 10*3/uL (ref 150–400)
RBC: 4.6 MIL/uL (ref 3.87–5.11)
RDW: 14.3 % (ref 11.5–15.5)
WBC: 9.9 10*3/uL (ref 4.0–10.5)
nRBC: 0 % (ref 0.0–0.2)

## 2020-04-22 LAB — COMPREHENSIVE METABOLIC PANEL
ALT: 98 U/L — ABNORMAL HIGH (ref 0–44)
AST: 32 U/L (ref 15–41)
Albumin: 3.1 g/dL — ABNORMAL LOW (ref 3.5–5.0)
Alkaline Phosphatase: 68 U/L (ref 38–126)
Anion gap: 12 (ref 5–15)
BUN: 27 mg/dL — ABNORMAL HIGH (ref 8–23)
CO2: 25 mmol/L (ref 22–32)
Calcium: 8.8 mg/dL — ABNORMAL LOW (ref 8.9–10.3)
Chloride: 101 mmol/L (ref 98–111)
Creatinine, Ser: 0.89 mg/dL (ref 0.44–1.00)
GFR calc Af Amer: >60 mL/min (ref >60)
GFR calc non Af Amer: >60 mL/min (ref >60)
Glucose, Bld: 174 mg/dL — ABNORMAL HIGH (ref 70–99)
Potassium: 3.5 mmol/L (ref 3.5–5.1)
Sodium: 138 mmol/L (ref 135–145)
Total Bilirubin: 0.4 mg/dL (ref 0.3–1.2)
Total Protein: 7.3 g/dL (ref 6.5–8.1)

## 2020-04-22 LAB — C-REACTIVE PROTEIN: CRP: 6.5 mg/dL — ABNORMAL HIGH (ref <1.0)

## 2020-04-22 LAB — D-DIMER, QUANTITATIVE: D-Dimer, Quant: 2.52 ug/mL-FEU — ABNORMAL HIGH (ref 0.00–0.50)

## 2020-04-22 LAB — FERRITIN: Ferritin: 449 ng/mL — ABNORMAL HIGH (ref 11–307)

## 2020-04-22 NOTE — Progress Notes (Signed)
PROGRESS NOTE  Molly Grant  DOB: 01/31/1951  PCP: Patient, No Pcp Per [id number]  DOA: 04/20/2020  LOS: 2 days   Chief Complaint  Patient presents with  . Shortness of Breath    Brief narrative: Molly Grant is a 69 y.o. female with no known past medical history and not on meds at home. Patient presented to the ED on 04/20/2020 with complaint of 1 week history of shortness of breath and cough, progressively getting worse.  EMS noted oxygen saturation to be in 80s on room air and brought to the ED. Of note, patient is not vaccinated against COVID-19.  In the ED, patient was afebrile, heart rate 102, respiratory 33, oxygen saturation 87% on room air improved to more than 90% on 3 L by nasal cannula.  Blood pressure 189/105. COVID-19 PCR positive. Labs with normal CBC, normal procalcitonin, normal lactic acid, CRP elevated to 16.2, ferritin elevated to 691, LDH elevated to 310, D-dimer 2.28 BMP normal, AST/ALT elevated to 102/123. Chest x-ray showed bibasilar infiltrates. Patient was admitted to hospitalist service for further evaluation management.  Subjective: Patient was seen and examined this morning.   Propped up in bed.  Feels much better than at presentation.  Season 3 L oxygen by nasal cannula.  Elderly African-American female.  Propped up in bed.  Feels better than at presentation.  On 3 L oxygen by nasal cannula. Chart reviewed No fever.  Respiratory team 20s, remains on supplemental oxygen. Blood pressure overnight has remained elevated mostly over 180. Labs this morning with AST/ALT further elevated to 120/149, ferritin up to 758, CRP coming down, 14.3, D-dimer up 3.62.  Assessment/Plan: COVID pneumonia Acute respiratory failure with hypoxia  -Presented with progressively worsening shortness of breath, cough -COVID test: PCR positive on admission -Chest imaging: Chest x-ray admission showed bibasal infiltrates  -Treatment: Patient has been started on IV  remdesivir, IV Solu-Medrol 60 mg twice daily.  Currently not requiring Actemra or baricitinib.   -Oxygen - SpO2: 90 % O2 Flow Rate (L/min): 3 L/min  -Progression: Feels better than at presentation.  CRP slightly improving.  Remains on supplemental oxygen.  Respiratory status is stable.  Continue remdesivir and steroid as above.   -Protonix while on steroids. -Supportive care: Vitamin C, Zinc, PRN inhalers, Tylenol, Antitussives (benzonatate/ Mucinex/Tussionex).   -Encouraged incentive spirometry, out of bed and early mobilization as much as possible -Continue airborne/contact isolation precautions for duration of 3 weeks from the day of diagnosis. -WBC and inflammatory markers trend as below.  CRP improving, ferritin and D-dimer is up.  Lab Results  Component Value Date   SARSCOV2NAA POSITIVE (A) 04/20/2020    Recent Labs  Lab 04/20/20 1015 04/20/20 1228 04/20/20 1230 04/21/20 0500 04/22/20 0443  WBC 9.4  --   --  7.0 9.9  LATICACIDVEN  --  1.3  --   --   --   PROCALCITON  --   --  <0.10  --   --   DDIMER  --   --  2.28* 3.62* 2.52*  FERRITIN  --   --  691* 758* 449*  LDH  --   --  310*  --   --   CRP  --   --  16.2* 14.3* 6.5*  ALT 123*  --   --  149* 98*   The treatment plan and use of medications and known side effects were discussed with patient/family. Some of the medications used are based on case reports/anecdotal data.  All other  medications being used in the management of COVID-19 based on limited study data.  Complete risks and long-term side effects are unknown, however in the best clinical judgment they seem to be of some benefit.  Patient wanted to proceed with treatment options provided.  Hypertensive of urgency -No known history of hypertension. Blood pressure in the ED was elevated up to 200. -I started the patient on amlodipine 5 mg daily and HCTZ 25 mg daily.  -Blood pressure has now improved down to normal. -IV hydralazine as needed.  Dyslipidemia -Patient  would benefit from statin once liver enzymes normalizes. Lipid Panel     Component Value Date/Time   CHOL 181 04/21/2020 1756   TRIG 53 04/21/2020 1756   HDL 27 (L) 04/21/2020 1756   CHOLHDL 6.7 04/21/2020 1756   VLDL 11 04/21/2020 1756   LDLCALC 143 (H) 04/21/2020 1756   Elevated LFTs -Suspect secondary to Covid. -Reports only occasional alcohol use. -Right upper quadrant ultrasound normal.  Acute hepatitis panel normal.   -Liver enzyme level trend as below.  Improving  Recent Labs  Lab 04/20/20 1015 04/21/20 0500 04/22/20 0443  AST 102* 120* 32  ALT 123* 149* 98*  ALKPHOS 80 86 68  BILITOT 0.9 0.6 0.4  PROT 7.9 8.1 7.3  ALBUMIN 3.4* 3.3* 3.1*   Hepatitis Latest Ref Rng & Units 04/21/2020  Hep B Surface Ag NON REACTIVE NON REACTIVE  Hep B IgM NON REACTIVE NON REACTIVE  Hep C Ab NON REACTIVE NON REACTIVE  Hep A IgM NON REACTIVE NON REACTIVE    Mobility: Encourage ambulation Code Status:   Code Status: Full Code  Nutritional status: There is no height or weight on file to calculate BMI.     Diet Order            Diet regular Room service appropriate? Yes; Fluid consistency: Thin  Diet effective now                 DVT prophylaxis: enoxaparin (LOVENOX) injection 40 mg Start: 04/20/20 2200   Antimicrobials:  None Fluid: None  Consultants: None Family Communication:  None at bedside  Status is: Inpatient  Remains inpatient appropriate because:Ongoing diagnostic testing needed not appropriate for outpatient work up and IV treatments appropriate due to intensity of illness or inability to take PO   Dispo: The patient is from: Home              Anticipated d/c is to: Home              Anticipated d/c date is: 3 days              Patient currently is not medically stable to d/c.   Infusions:  . remdesivir 100 mg in NS 100 mL 100 mg (04/22/20 0906)    Scheduled Meds: . albuterol  2 puff Inhalation Q6H  . amLODipine  5 mg Oral Daily  . vitamin C  500  mg Oral Daily  . enoxaparin (LOVENOX) injection  40 mg Subcutaneous Q24H  . hydrochlorothiazide  25 mg Oral Daily  . methylPREDNISolone (SOLU-MEDROL) injection  60 mg Intravenous Q12H  . pantoprazole  40 mg Oral Daily  . zinc sulfate  220 mg Oral Daily    Antimicrobials: Anti-infectives (From admission, onward)   Start     Dose/Rate Route Frequency Ordered Stop   04/21/20 1000  remdesivir 100 mg in sodium chloride 0.9 % 100 mL IVPB  Status:  Discontinued       "  Followed by" Linked Group Details   100 mg 200 mL/hr over 30 Minutes Intravenous Daily 04/20/20 1220 04/20/20 1233   04/21/20 1000  remdesivir 100 mg in sodium chloride 0.9 % 100 mL IVPB       "Followed by" Linked Group Details   100 mg 200 mL/hr over 30 Minutes Intravenous Daily 04/20/20 1233 04/25/20 0959   04/20/20 1400  remdesivir 200 mg in sodium chloride 0.9% 250 mL IVPB  Status:  Discontinued       "Followed by" Linked Group Details   200 mg 580 mL/hr over 30 Minutes Intravenous Once 04/20/20 1220 04/20/20 1233   04/20/20 1300  remdesivir 200 mg in sodium chloride 0.9% 250 mL IVPB       "Followed by" Linked Group Details   200 mg 580 mL/hr over 30 Minutes Intravenous Once 04/20/20 1233 04/20/20 1359      PRN meds: acetaminophen **OR** acetaminophen, chlorpheniramine-HYDROcodone, guaiFENesin-dextromethorphan, hydrALAZINE   Objective: Vitals:   04/22/20 1200 04/22/20 1411  BP:  139/85  Pulse:  88  Resp:  20  Temp:  98 F (36.7 C)  SpO2: 90% 93%    Intake/Output Summary (Last 24 hours) at 04/22/2020 1438 Last data filed at 04/22/2020 1230 Gross per 24 hour  Intake 220 ml  Output 600 ml  Net -380 ml   There were no vitals filed for this visit. Weight change:  There is no height or weight on file to calculate BMI.   Physical Exam: General exam: Appears calm and comfortable. Not in physical distress Skin: No rashes, lesions or ulcers. HEENT: Atraumatic, normocephalic, supple neck, no obvious  bleeding Lungs: Clear to auscultation bilaterally CVS: Regular rate and rhythm with no murmur GI/Abd soft, nontender, nondistended, bowel sound present CNS: Alert, awake, oriented x3 Psychiatry: Mood appropriate Extremities: No pedal edema, no calf tenderness  Data Review: I have personally reviewed the laboratory data and studies available.  Recent Labs  Lab 04/20/20 1015 04/21/20 0500 04/22/20 0443  WBC 9.4 7.0 9.9  NEUTROABS 8.1* 6.1 8.9*  HGB 13.9 14.7 14.1  HCT 43.6 46.4* 44.5  MCV 95.2 97.1 96.7  PLT 224 250 334   Recent Labs  Lab 04/20/20 1015 04/20/20 1040 04/21/20 0500 04/22/20 0443  NA 141  --  141 138  K 3.7  --  3.9 3.5  CL 103  --  104 101  CO2 26  --  25 25  GLUCOSE 118*  --  124* 174*  BUN 23  --  25* 27*  CREATININE 0.91 0.77 0.71 0.89  CALCIUM 9.0  --  9.1 8.8*   Lab Results  Component Value Date   HGBA1C 6.0 (H) 04/21/2020       Component Value Date/Time   CHOL 181 04/21/2020 1756   TRIG 53 04/21/2020 1756   HDL 27 (L) 04/21/2020 1756   CHOLHDL 6.7 04/21/2020 1756   VLDL 11 04/21/2020 1756   LDLCALC 143 (H) 04/21/2020 1756    Signed, Lorin Glass, MD [facility] Pager: [phone] (Secure Chat preferred). 04/22/2020

## 2020-04-23 LAB — CBC WITH DIFFERENTIAL/PLATELET
Abs Immature Granulocytes: 0.1 10*3/uL — ABNORMAL HIGH (ref 0.00–0.07)
Basophils Absolute: 0 10*3/uL (ref 0.0–0.1)
Basophils Relative: 0 %
Eosinophils Absolute: 0 10*3/uL (ref 0.0–0.5)
Eosinophils Relative: 0 %
HCT: 42.8 % (ref 36.0–46.0)
Hemoglobin: 13.9 g/dL (ref 12.0–15.0)
Immature Granulocytes: 1 %
Lymphocytes Relative: 6 %
Lymphs Abs: 0.7 10*3/uL (ref 0.7–4.0)
MCH: 30.4 pg (ref 26.0–34.0)
MCHC: 32.5 g/dL (ref 30.0–36.0)
MCV: 93.7 fL (ref 80.0–100.0)
Monocytes Absolute: 0.3 10*3/uL (ref 0.1–1.0)
Monocytes Relative: 3 %
Neutro Abs: 10.2 10*3/uL — ABNORMAL HIGH (ref 1.7–7.7)
Neutrophils Relative %: 90 %
Platelets: 419 10*3/uL — ABNORMAL HIGH (ref 150–400)
RBC: 4.57 MIL/uL (ref 3.87–5.11)
RDW: 14.3 % (ref 11.5–15.5)
WBC: 11.3 10*3/uL — ABNORMAL HIGH (ref 4.0–10.5)
nRBC: 0 % (ref 0.0–0.2)

## 2020-04-23 LAB — COMPREHENSIVE METABOLIC PANEL
ALT: 71 U/L — ABNORMAL HIGH (ref 0–44)
AST: 22 U/L (ref 15–41)
Albumin: 3.1 g/dL — ABNORMAL LOW (ref 3.5–5.0)
Alkaline Phosphatase: 64 U/L (ref 38–126)
Anion gap: 11 (ref 5–15)
BUN: 41 mg/dL — ABNORMAL HIGH (ref 8–23)
CO2: 26 mmol/L (ref 22–32)
Calcium: 8.6 mg/dL — ABNORMAL LOW (ref 8.9–10.3)
Chloride: 101 mmol/L (ref 98–111)
Creatinine, Ser: 1.19 mg/dL — ABNORMAL HIGH (ref 0.44–1.00)
GFR calc Af Amer: 54 mL/min — ABNORMAL LOW (ref >60)
GFR calc non Af Amer: 47 mL/min — ABNORMAL LOW (ref >60)
Glucose, Bld: 165 mg/dL — ABNORMAL HIGH (ref 70–99)
Potassium: 3.7 mmol/L (ref 3.5–5.1)
Sodium: 138 mmol/L (ref 135–145)
Total Bilirubin: 0.4 mg/dL (ref 0.3–1.2)
Total Protein: 7 g/dL (ref 6.5–8.1)

## 2020-04-23 LAB — D-DIMER, QUANTITATIVE: D-Dimer, Quant: 2.28 ug/mL-FEU — ABNORMAL HIGH (ref 0.00–0.50)

## 2020-04-23 LAB — FERRITIN: Ferritin: 343 ng/mL — ABNORMAL HIGH (ref 11–307)

## 2020-04-23 LAB — C-REACTIVE PROTEIN: CRP: 3.4 mg/dL — ABNORMAL HIGH (ref <1.0)

## 2020-04-23 NOTE — Evaluation (Signed)
Physical Therapy Evaluation Patient Details Name: Molly Grant MRN: [id number] DOB: Jun 01, 1951 Today's Date: 04/23/2020   History of Present Illness  69 yo female admitted with COVID  Clinical Impression  On eval, pt required Min assist to stand. Limited eval 2* pt has poor activity tolerance/reserve. Only able to stand for ~45 seconds at the longest before dyspneic and fatigued. Remained on [place] during session. Max encouragement for progression of activity. Pt was unable to take any ambulatory steps on today. Encouraged pt to practice using incentive spirometer 10 reps every hour. Do not feel pt can safely manage at home alone in this condition. Encouraged pt to try to mobilize a [place] more every day. Will plan to follow and progress activity as tolerated.     Follow Up Recommendations SNF (unless pt improves significantly. If pt declines placement, then HHPT and 24 hour supervision/assist)    Equipment Recommendations  Rolling walker with 5" wheels;3in1 (PT)    Recommendations for Other Services       Precautions / Restrictions Precautions Precautions: Fall Precaution Comments: very poor reserve Restrictions Weight Bearing Restrictions: No      Mobility  Bed Mobility               General bed mobility comments: oob in recliner  Transfers Overall transfer level: Needs assistance Equipment used: None;Rolling walker (2 wheeled) Transfers: Sit to/from Stand Sit to Stand: Min assist         General transfer comment: Assist to power up, stabilize, control descent. Cues for hand placement. Pt stood for ~30 seconds first time, then ~45 seconds 2nd time. Dyspnea 3/4.  Ambulation/Gait             General Gait Details: Pt is unable at this time-poor activity tolerance  Stairs            Wheelchair Mobility    Modified Rankin (Stroke Patients Only)       Balance Overall balance assessment: Needs assistance         Standing balance support:  Bilateral upper extremity supported Standing balance-Leahy Scale: Poor                               Pertinent Vitals/Pain Pain Assessment: No/denies pain    Home Living Family/patient expects to be discharged to:: Private residence Living Arrangements: Alone Available Help at Discharge: Family Type of Home: House       Home Layout: One level Home Equipment: None      Prior Function Level of Independence: Independent         Comments: playing tennis/golf     Hand Dominance        Extremity/Trunk Assessment   Upper Extremity Assessment Upper Extremity Assessment: Defer to OT evaluation    Lower Extremity Assessment Lower Extremity Assessment: Generalized weakness    Cervical / Trunk Assessment Cervical / Trunk Assessment: Normal  Communication   Communication: No difficulties  Cognition Arousal/Alertness: Awake/alert Behavior During Therapy: WFL for tasks assessed/performed;Anxious Overall Cognitive Status: Within Functional Limits for tasks assessed                                        General Comments      Exercises     Assessment/Plan    PT Assessment Patient needs continued PT services  PT Problem List Decreased strength;Decreased  mobility;Decreased activity tolerance;Decreased balance;Cardiopulmonary status limiting activity       PT Treatment Interventions DME instruction;Gait training;Therapeutic activities;Therapeutic exercise;Patient/family education;Balance training;Functional mobility training    PT Goals (Current goals can be found in the Care Plan section)  Acute Rehab PT Goals Patient Stated Goal: to breathe better. to regain PLOF PT Goal Formulation: With patient Time For Goal Achievement: 05/07/20 Potential to Achieve Goals: Good    Frequency Min 3X/week   Barriers to discharge Decreased caregiver support      Co-evaluation               AM-PAC PT "6 Clicks" Mobility  Outcome  Measure Help needed turning from your back to your side while in a flat bed without using bedrails?: A Lot Help needed moving from lying on your back to sitting on the side of a flat bed without using bedrails?: A Lot Help needed moving to and from a bed to a chair (including a wheelchair)?: A Lot Help needed standing up from a chair using your arms (e.g., wheelchair or bedside chair)?: A Lot Help needed to walk in hospital room?: A Lot Help needed climbing 3-5 steps with a railing? : Total 6 Click Score: 11    End of Session Equipment Utilized During Treatment: Oxygen Activity Tolerance: Patient limited by fatigue (limited by dyspnea) Patient left: in chair;with call bell/phone within reach   PT Visit Diagnosis: Muscle weakness (generalized) (M62.81);Difficulty in walking, not elsewhere classified (R26.2)    Time: 9147-8295 PT Time Calculation (min) (ACUTE ONLY): 22 min   Charges:   PT Evaluation $PT Eval Low Complexity: 1 Low          Faye Ramsay, PT Acute Rehabilitation  Office: [phone] Pager: [phone]

## 2020-04-23 NOTE — Care Management Important Message (Signed)
Important Message  Patient Details IM Letter given to the Patient Name: Molly Grant MRN: [id number] Date of Birth: 1951-07-05   Medicare Important Message Given:  Yes     Caren Macadam 04/23/2020, 12:13 PM

## 2020-04-23 NOTE — Progress Notes (Addendum)
PROGRESS NOTE  Molly Grant  DOB: 1950-09-10  PCP: Patient, No Pcp Per GQQ:[id number]  DOA: 04/20/2020  LOS: 3 days   Chief Complaint  Patient presents with  . Shortness of Breath    Brief narrative: Molly Grant is a 69 y.o. female with no known past medical history and not on meds at home. Patient presented to the ED on 04/20/2020 with complaint of 1 week history of shortness of breath and cough, progressively getting worse.  EMS noted oxygen saturation to be in 80s on room air and brought to the ED. Of note, patient is not vaccinated against COVID-19.  In the ED, patient was afebrile, heart rate 102, respiratory 33, oxygen saturation 87% on room air improved to more than 90% on 3 L by nasal cannula.  Blood pressure 189/105. COVID-19 PCR positive. Labs with normal CBC, normal procalcitonin, normal lactic acid, CRP elevated to 16.2, ferritin elevated to 691, LDH elevated to 310, D-dimer 2.28 BMP normal, AST/ALT elevated to 102/123. Chest x-ray showed bibasilar infiltrates. Patient was admitted to hospitalist service for further evaluation management.  Subjective: Patient was seen and examined this morning.   Sitting up in chair.  Not in distress. However, earlier while getting up from bed to the chair, she was very weak and dyspneic. CRP down today, creatinine up to 1.19.  Assessment/Plan: COVID pneumonia Acute respiratory failure with hypoxia  -Presented with progressively worsening shortness of breath, cough -COVID test: PCR positive on admission -Chest imaging: Chest x-ray admission showed bibasal infiltrates.  -Treatment: Patient has been started on IV remdesivir, IV Solu-Medrol 60 mg twice daily. Currently not requiring Actemra or baricitinib.  -On 3 L oxygen at rest.  -Progression: Feels better gradually.  CRP improving.  On 2 L oxygen at rest.  May need higher on ambulation.  PT eval pending.   -Continue remdesivir and steroid as above.   -Protonix while on  steroids. -Supportive care: Vitamin C, Zinc, PRN inhalers, Tylenol, Antitussives (benzonatate/ Mucinex/Tussionex).   -Encouraged incentive spirometry, out of bed and early mobilization as much as possible -Continue airborne/contact isolation precautions for duration of 3 weeks from the day of diagnosis. -WBC and inflammatory markers trend as below.   Lab Results  Component Value Date   SARSCOV2NAA POSITIVE (A) 04/20/2020    Recent Labs  Lab 04/20/20 1015 04/20/20 1228 04/20/20 1230 04/21/20 0500 04/22/20 0443 04/23/20 0413  WBC 9.4  --   --  7.0 9.9 11.3*  LATICACIDVEN  --  1.3  --   --   --   --   PROCALCITON  --   --  <0.10  --   --   --   DDIMER  --   --  2.28* 3.62* 2.52* 2.28*  FERRITIN  --   --  691* 758* 449* 343*  LDH  --   --  310*  --   --   --   CRP  --   --  16.2* 14.3* 6.5* 3.4*  ALT 123*  --   --  149* 98* 71*   The treatment plan and use of medications and known side effects were discussed with patient/family. Some of the medications used are based on case reports/anecdotal data.  All other medications being used in the management of COVID-19 based on limited study data.  Complete risks and long-term side effects are unknown, however in the best clinical judgment they seem to be of some benefit.  Patient wanted to proceed with treatment options provided.  Hypertensive of urgency -No known history of hypertension. Blood pressure in the ED was elevated up to 200. -Currently on amlodipine and HCTZ.  Because of uptrending creatinine, stop HCTZ. Continue amlodipine. -Blood pressure has now improved down to normal. -IV hydralazine as needed.  AKI -Creatinine elevated 1.19 today.  HCTZ stopped.  Repeat labs tomorrow. Recent Labs    04/20/20 1015 04/20/20 1040 04/21/20 0500 04/22/20 0443 04/23/20 0413  CREATININE 0.91 0.77 0.71 0.89 1.19*   Elevated LFTs -Suspect secondary to Covid. -Reports only occasional alcohol use. -Right upper quadrant ultrasound normal.   Acute hepatitis panel normal.   -Liver enzyme level trend as below.  Improving Recent Labs  Lab 04/20/20 1015 04/21/20 0500 04/22/20 0443 04/23/20 0413  AST 102* 120* 32 22  ALT 123* 149* 98* 71*  ALKPHOS 80 86 68 64  BILITOT 0.9 0.6 0.4 0.4  PROT 7.9 8.1 7.3 7.0  ALBUMIN 3.4* 3.3* 3.1* 3.1*   Hepatitis Latest Ref Rng & Units 04/21/2020  Hep B Surface Ag NON REACTIVE NON REACTIVE  Hep B IgM NON REACTIVE NON REACTIVE  Hep C Ab NON REACTIVE NON REACTIVE  Hep A IgM NON REACTIVE NON REACTIVE   Dyslipidemia -Patient would benefit from statin once liver enzymes normalizes.    Component Value Date/Time   CHOL 181 04/21/2020 1756   TRIG 53 04/21/2020 1756   HDL 27 (L) 04/21/2020 1756   CHOLHDL 6.7 04/21/2020 1756   VLDL 11 04/21/2020 1756   LDLCALC 143 (H) 04/21/2020 1756   Mobility: Encourage ambulation Code Status:   Code Status: Full Code  Nutritional status: There is no height or weight on file to calculate BMI.     Diet Order            Diet regular Room service appropriate? Yes; Fluid consistency: Thin  Diet effective now                 DVT prophylaxis: enoxaparin (LOVENOX) injection 40 mg Start: 04/20/20 2200   Antimicrobials:  None Fluid: None  Consultants: None Family Communication:  None at bedside  Status is: Inpatient  Remains inpatient appropriate because: Getting IV remdesivir, IV steroids, pending PT evaluation, continues to require oxygen.  Dispo: The patient is from: Home              Anticipated d/c is to: Home.  Pending PT eval              Anticipated d/c date is: 2 to 3 days              Patient currently is not medically stable to d/c.   Infusions:  . remdesivir 100 mg in NS 100 mL 100 mg (04/23/20 0931)    Scheduled Meds: . albuterol  2 puff Inhalation Q6H  . amLODipine  5 mg Oral Daily  . vitamin C  500 mg Oral Daily  . enoxaparin (LOVENOX) injection  40 mg Subcutaneous Q24H  . methylPREDNISolone (SOLU-MEDROL) injection  60 mg  Intravenous Q12H  . pantoprazole  40 mg Oral Daily  . zinc sulfate  220 mg Oral Daily    Antimicrobials: Anti-infectives (From admission, onward)   Start     Dose/Rate Route Frequency Ordered Stop   04/21/20 1000  remdesivir 100 mg in sodium chloride 0.9 % 100 mL IVPB  Status:  Discontinued       "Followed by" Linked Group Details   100 mg 200 mL/hr over 30 Minutes Intravenous Daily 04/20/20 1220 04/20/20 1233  04/21/20 1000  remdesivir 100 mg in sodium chloride 0.9 % 100 mL IVPB       "Followed by" Linked Group Details   100 mg 200 mL/hr over 30 Minutes Intravenous Daily 04/20/20 1233 04/25/20 0959   04/20/20 1400  remdesivir 200 mg in sodium chloride 0.9% 250 mL IVPB  Status:  Discontinued       "Followed by" Linked Group Details   200 mg 580 mL/hr over 30 Minutes Intravenous Once 04/20/20 1220 04/20/20 1233   04/20/20 1300  remdesivir 200 mg in sodium chloride 0.9% 250 mL IVPB       "Followed by" Linked Group Details   200 mg 580 mL/hr over 30 Minutes Intravenous Once 04/20/20 1233 04/20/20 1359      PRN meds: acetaminophen **OR** acetaminophen, chlorpheniramine-HYDROcodone, guaiFENesin-dextromethorphan, hydrALAZINE   Objective: Vitals:   04/23/20 0730 04/23/20 1450  BP:  (!) 137/94  Pulse:  97  Resp:  18  Temp:  98.1 F (36.7 C)  SpO2: 92% 94%    Intake/Output Summary (Last 24 hours) at 04/23/2020 1500 Last data filed at 04/23/2020 1312 Gross per 24 hour  Intake 708 ml  Output 800 ml  Net -92 ml   There were no vitals filed for this visit. Weight change:  There is no height or weight on file to calculate BMI.   Physical Exam: General exam: Appears calm and comfortable. Not in physical distress Skin: No rashes, lesions or ulcers. HEENT: Atraumatic, normocephalic, supple neck, no obvious bleeding Lungs: Minimal bilateral basilar crackles. CVS: Regular rate and rhythm with no murmur GI/Abd soft, nontender, nondistended, bowel sound present CNS: Alert, awake,  oriented x3 Psychiatry: Mood appropriate Extremities: No pedal edema, no calf tenderness  Data Review: I have personally reviewed the laboratory data and studies available.  Recent Labs  Lab 04/20/20 1015 04/21/20 0500 04/22/20 0443 04/23/20 0413  WBC 9.4 7.0 9.9 11.3*  NEUTROABS 8.1* 6.1 8.9* 10.2*  HGB 13.9 14.7 14.1 13.9  HCT 43.6 46.4* 44.5 42.8  MCV 95.2 97.1 96.7 93.7  PLT 224 250 334 419*   Recent Labs  Lab 04/20/20 1015 04/20/20 1040 04/21/20 0500 04/22/20 0443 04/23/20 0413  NA 141  --  141 138 138  K 3.7  --  3.9 3.5 3.7  CL 103  --  104 101 101  CO2 26  --  25 25 26   GLUCOSE 118*  --  124* 174* 165*  BUN 23  --  25* 27* 41*  CREATININE 0.91 0.77 0.71 0.89 1.19*  CALCIUM 9.0  --  9.1 8.8* 8.6*   Lab Results  Component Value Date   HGBA1C 6.0 (H) 04/21/2020       Component Value Date/Time   CHOL 181 04/21/2020 1756   TRIG 53 04/21/2020 1756   HDL 27 (L) 04/21/2020 1756   CHOLHDL 6.7 04/21/2020 1756   VLDL 11 04/21/2020 1756   LDLCALC 143 (H) 04/21/2020 1756    Signed, 04/23/2020, MD [facility] Pager: [phone] (Secure Chat preferred). 04/23/2020

## 2020-04-24 LAB — CBC WITH DIFFERENTIAL/PLATELET
Abs Immature Granulocytes: 0.09 10*3/uL — ABNORMAL HIGH (ref 0.00–0.07)
Basophils Absolute: 0 10*3/uL (ref 0.0–0.1)
Basophils Relative: 0 %
Eosinophils Absolute: 0 10*3/uL (ref 0.0–0.5)
Eosinophils Relative: 0 %
HCT: 42.5 % (ref 36.0–46.0)
Hemoglobin: 13.5 g/dL (ref 12.0–15.0)
Immature Granulocytes: 1 %
Lymphocytes Relative: 7 %
Lymphs Abs: 0.6 10*3/uL — ABNORMAL LOW (ref 0.7–4.0)
MCH: 30.4 pg (ref 26.0–34.0)
MCHC: 31.8 g/dL (ref 30.0–36.0)
MCV: 95.7 fL (ref 80.0–100.0)
Monocytes Absolute: 0.3 10*3/uL (ref 0.1–1.0)
Monocytes Relative: 3 %
Neutro Abs: 7.7 10*3/uL (ref 1.7–7.7)
Neutrophils Relative %: 89 %
Platelets: 398 10*3/uL (ref 150–400)
RBC: 4.44 MIL/uL (ref 3.87–5.11)
RDW: 14.2 % (ref 11.5–15.5)
WBC: 8.8 10*3/uL (ref 4.0–10.5)
nRBC: 0 % (ref 0.0–0.2)

## 2020-04-24 LAB — COMPREHENSIVE METABOLIC PANEL
ALT: 56 U/L — ABNORMAL HIGH (ref 0–44)
AST: 23 U/L (ref 15–41)
Albumin: 3 g/dL — ABNORMAL LOW (ref 3.5–5.0)
Alkaline Phosphatase: 54 U/L (ref 38–126)
Anion gap: 11 (ref 5–15)
BUN: 43 mg/dL — ABNORMAL HIGH (ref 8–23)
CO2: 25 mmol/L (ref 22–32)
Calcium: 8.4 mg/dL — ABNORMAL LOW (ref 8.9–10.3)
Chloride: 99 mmol/L (ref 98–111)
Creatinine, Ser: 1.19 mg/dL — ABNORMAL HIGH (ref 0.44–1.00)
GFR calc Af Amer: 54 mL/min — ABNORMAL LOW (ref >60)
GFR calc non Af Amer: 47 mL/min — ABNORMAL LOW (ref >60)
Glucose, Bld: 161 mg/dL — ABNORMAL HIGH (ref 70–99)
Potassium: 3.9 mmol/L (ref 3.5–5.1)
Sodium: 135 mmol/L (ref 135–145)
Total Bilirubin: 0.6 mg/dL (ref 0.3–1.2)
Total Protein: 6.4 g/dL — ABNORMAL LOW (ref 6.5–8.1)

## 2020-04-24 LAB — FERRITIN: Ferritin: 266 ng/mL (ref 11–307)

## 2020-04-24 LAB — D-DIMER, QUANTITATIVE: D-Dimer, Quant: 2.31 ug/mL-FEU — ABNORMAL HIGH (ref 0.00–0.50)

## 2020-04-24 LAB — C-REACTIVE PROTEIN: CRP: 1.7 mg/dL — ABNORMAL HIGH (ref <1.0)

## 2020-04-24 NOTE — Evaluation (Signed)
Occupational Therapy Evaluation Patient Details Name: Molly Grant MRN: [id number] DOB: [birth year] Today's Date: 04/24/2020    History of Present Illness Molly Grant is a 69 y.o. female with no known past medical history and not on meds at home admitted to hospital with COVID.   Clinical Impression   Molly Grant is a 69 year old woman who presents with functional upper extremity strength with MMT but overall generalized weakness, decreased cardiopulmonary endurance and poor activity tolerance resulting in impaired ability to perform independent mobility and ADLs. Patient on 2.5 L [place] with 100% o2 sat in supine. On room air in supine patient 92%. With transfer into sitting patient dropped to 82% and Molly Grant. Patient exhibited coughing spell with attempts at pursed lipped breathing limiting oxygenation recovery. Patient required significant time to recover o2 sats into 90s and before she would attempt to stand. Patient returned to 92% and then transferred via stand pivot to recliner on Molly Grant. Patient once again dropped to 82% and had another coughing spell. Patient supervision for supine to sit, min guard for standing and stand pivot to recliner. Patient able to perform ADLs with setup in seated position with increased time and frequent breaks due to fatigue. Patient provided with education on POC, breathing techniques, activities to perform in chair and in bed. Patinet verbalized understanding. Patinet will benefit from skilled OT services to improve deficits and learn compensatory strategies in order to return to PLOF. Due to patient's lack of assistance at home and her prior independence (includign working from home) patient will benefit from short term rehab at discharge in order to return to independence.    Follow Up Recommendations  SNF    Equipment Recommendations  Other (comment) (Defer to next venue)    Recommendations for Other Services       Precautions / Restrictions  Precautions Precautions: Fall Precaution Comments: very poor reserve, monitor o2 sats Restrictions Weight Bearing Restrictions: No      Mobility Bed Mobility Overal bed mobility: Needs Assistance Bed Mobility: Supine to Sit     Supine to sit: Supervision     General bed mobility comments: increased time. Needed time to recover at edge of bed before atempting sit to stand.  Transfers Overall transfer level: Needs assistance Equipment used: None Transfers: Sit to/from [employer] Sit to Stand: Min guard Stand pivot transfers: Min guard       General transfer comment: Min guard to stand and take steps to recliner. No loss of balance. Patient fatigues quickly, sats drop and patient exhibits coughing spell.    Balance   Sitting-balance support: No upper extremity supported;Feet supported Sitting balance-Leahy Scale: Good     Standing balance support: During functional activity Standing balance-Leahy Scale: Fair                             ADL either performed or assessed with clinical judgement   ADL Overall ADL's : Needs assistance/impaired Eating/Feeding: Independent   Grooming: Sitting;Set up   Upper Body Bathing: Sitting;Set up   Lower Body Bathing: Set up;Min guard;Sit to/from stand   Upper Body Dressing : Set up;Sitting   Lower Body Dressing: Set up;Sit to/from stand;Min guard   Toilet Transfer: Min guard;Stand-pivot;BSC   Toileting- Architect and Hygiene: Min guard;Sit to/from stand       Functional mobility during ADLs: Min guard       Vision Baseline Vision/History: Wears glasses Wears Glasses:  Reading only       Perception     Praxis      Pertinent Vitals/Pain Pain Assessment: No/denies pain     Hand Dominance Right   Extremity/Trunk Assessment Upper Extremity Assessment Upper Extremity Assessment: Overall WFL for tasks assessed   Lower Extremity Assessment Lower Extremity Assessment:  Defer to PT evaluation   Cervical / Trunk Assessment Cervical / Trunk Assessment: Normal   Communication Communication Communication: No difficulties   Cognition Arousal/Alertness: Awake/alert Behavior During Therapy: WFL for tasks assessed/performed;Anxious Overall Cognitive Status: Within Functional Limits for tasks assessed                                     General Comments       Exercises     Shoulder Instructions      Home Living Family/patient expects to be discharged to:: Private residence Living Arrangements: Alone Available Help at Discharge: Family;Available PRN/intermittently Type of Home: House Home Access: Stairs to enter [employer] of Steps: 1   Home Layout: One level     Bathroom Shower/Tub: Chief Strategy Officer: Standard Bathroom Accessibility: Yes How Accessible: Accessible via walker Home Equipment: Shower seat          Prior Functioning/Environment Level of Independence: Independent        Comments: was playing tennis and golf prior to covid        OT Problem List: Decreased activity tolerance;Decreased knowledge of use of DME or AE;Cardiopulmonary status limiting activity      OT Treatment/Interventions: Self-care/ADL training;Energy conservation;DME and/or AE instruction;Therapeutic activities;Patient/family education    OT Goals(Current goals can be found in the care plan section) Acute Rehab OT Goals Patient Stated Goal: Improve endurance OT Goal Formulation: With patient Time For Goal Achievement: 05/08/20 Potential to Achieve Goals: Good  OT Frequency: Min 2X/week   Barriers to D/C: Decreased caregiver support          Co-evaluation              AM-PAC OT "6 Clicks" Daily Activity     Outcome Measure Help from another person eating meals?: None Help from another person taking care of personal grooming?: A Cafiero Help from another person toileting, which includes using  toliet, bedpan, or urinal?: A Montante Help from another person bathing (including washing, rinsing, drying)?: A Walle Help from another person to put on and taking off regular upper body clothing?: A Droge Help from another person to put on and taking off regular lower body clothing?: A Filosa 6 Click Score: 19   End of Session Equipment Utilized During Treatment: Oxygen Nurse Communication: Mobility status (o2 sats)  Activity Tolerance: Patient limited by fatigue Patient left: in chair;with call bell/phone within reach  OT Visit Diagnosis: Muscle weakness (generalized) (M62.81)                Time: 3893-7342 OT Time Calculation (min): 32 min Charges:  OT General Charges $OT Visit: 1 Visit OT Evaluation $OT Eval Low Complexity: 1 Low OT Treatments $Therapeutic Activity: 8-22 mins  Shayan Bramhall, OTR/L Acute Care Rehab Services  Office [phone] Pager: [phone]   Kelli Churn 04/24/2020, 1:47 PM

## 2020-04-24 NOTE — TOC Initial Note (Addendum)
Transition of Care [facility]) - Initial/Assessment Note    Patient Details  Name: Molly Grant MRN: [id number] Date of Birth: 10-04-1950  [facility]) CM/SW Contact:    Ida Rogue, LCSW Phone Number: 04/24/2020, 3:55 PM  Clinical Narrative:  Patient seen in follow up to OT recommendation of SNF.  Spoke with daughter who states her mother lives alone, and there is no one who can provide round the clock care post d/c.  I explained the bed search process, and promised to be in touch with her about offers.  She wondered about visitation, and I gave her the names of facilities so she could call and find out for herself.  Bed search initiated. English as a second language teacher initiated. Reference #[id number] TOC will continue to follow during the course of hospitalization.                  Expected Discharge Plan: Skilled Nursing Facility Barriers to Discharge: SNF Pending bed offer   Patient Goals and CMS Choice     Choice offered to / list presented to : Adult Children  Expected Discharge Plan and Services Expected Discharge Plan: Skilled Nursing Facility   Discharge Planning Services: CM Consult Post Acute Care Choice: Skilled Nursing Facility Living arrangements for the past 2 months: Single Family Home                                      Prior Living Arrangements/Services Living arrangements for the past 2 months: Single Family Home Lives with:: Self Patient language and need for interpreter reviewed:: Yes        Need for Family Participation in Patient Care: Yes (Comment) Care giver support system in place?: Yes (comment) Current home services: DME Criminal Activity/Legal Involvement Pertinent to Current Situation/Hospitalization: No - Comment as needed  Activities of Daily Living Home Assistive Devices/Equipment: None ADL Screening (condition at time of admission) Patient's cognitive ability adequate to safely complete daily activities?: Yes Is the patient  deaf or have difficulty hearing?: No Does the patient have difficulty seeing, even when wearing glasses/contacts?: No Does the patient have difficulty concentrating, remembering, or making decisions?: No Patient able to express need for assistance with ADLs?: No Does the patient have difficulty dressing or bathing?: No Independently performs ADLs?: Yes (appropriate for developmental age) Does the patient have difficulty walking or climbing stairs?: No Weakness of Legs: None Weakness of Arms/Hands: None  Permission Sought/Granted Permission sought to share information with : Family Supports Permission granted to share information with : Yes, Verbal Permission Granted  Share Information with NAME: Duwayne Heck, [phone], daughter           Emotional Assessment       Orientation: : Oriented to Self, Oriented to Place, Oriented to Situation Alcohol / Substance Use: Not Applicable Psych Involvement: No (comment)  Admission diagnosis:  Elevated liver enzymes [R74.8] Acute hypoxemic respiratory failure (HCC) [J96.01] Pneumonia due to COVID-19 virus [U07.1, J12.82] COVID-19 [U07.1] Patient Active Problem List   Diagnosis Date Noted  . Pneumonia due to COVID-19 virus 04/20/2020  . Hypertensive urgency 04/20/2020  . Elevated LFTs 04/20/2020   PCP:  Patient, No Pcp Per Pharmacy:   [id number] [place], [place] - [address] [place] [address] Phone: [phone] Fax: [phone]     Social Determinants of Health (SDOH) Interventions    Readmission Risk Interventions No  flowsheet data found.

## 2020-04-24 NOTE — Progress Notes (Signed)
PROGRESS NOTE    Molly FountainYvonne F Mormino  ZOX:[id number] DOB: 08/20/1951 DOA: 04/20/2020 PCP: Patient, No Pcp Per    Brief Narrative:  Molly FountainYvonne F Grant is a 69 y.o. female with no known past medical history who  presented to the ED on 04/20/2020 with 1 week history of progressive shortness of breath associated with cough.  EMS noted oxygen saturation to be in 80s on room air and brought to the ED. Of note, patient is not vaccinated against COVID-19.  In the ED, patient was afebrile, heart rate 102, respiratory 33, oxygen saturation 87% on room air improved to more than 90% on 3 L by nasal cannula.  Blood pressure 189/105. COVID-19 PCR positive. Labs with normal CBC, normal procalcitonin, normal lactic acid, CRP elevated to 16.2, ferritin elevated to 691, LDH elevated to 310, D-dimer 2.28. BMP normal, AST/ALT elevated to 102/123. Chest x-ray showed bibasilar infiltrates. Patient was admitted to hospitalist service for further evaluation management.   Assessment & Plan:   Active Problems:   Pneumonia due to COVID-19 virus   Hypertensive urgency   Elevated LFTs   Acute hypoxic respiratory failure secondary to acute Covid-19 viral pneumonia during the ongoing 2020/2021 Covid 19 Pandemic - POA Patient presented with progressive shortness of breath and cough.  Chest x-ray notable for by basal infiltrates consistent with multifocal viral pneumonia. --COVID test: PCR + 04/20/2020 --CRP 14.3> 6.5> 3.4> 1.7 --ddimer 3.62> 2.52> 2.28> 2.31 --Completed a 5-day course of remdesivir on 9/1 --Continue Solumedrol 60 mg IV every 12 hours --prone for 2-3hrs every 12hrs if able --Continue supplemental oxygen, titrate to maintain SPO2 greater than 92%, on 3 L nasal cannula with SPO2 98% --Continue supportive care with albuterol MDI prn, vitamin C, zinc, Tylenol, antitussives (benzonatate/ Mucinex/Tussionex) --Incentive spirometry, flutter valve --Follow CBC, CMP, D-dimer, ferritin, and CRP daily --Continue  airborne/contact isolation precautions for 3 weeks from the day of diagnosis --Obtain O2 desaturation screen  The treatment plan and use of medications and known side effects were discussed with patient/family. Some of the medications used are based on case reports/anecdotal data.  All other medications being used in the management of COVID-19 based on limited study data.  Complete risks and long-term side effects are unknown, however in the best clinical judgment they seem to be of some benefit.  Patient wanted to proceed with treatment options provided.  Essential hypertension BP 136/78 this morning. --Continue amlodipine 5 mg p.o. daily  Acute renal failure Creatinine up to 1.19 with a baseline 0.7. --Avoid nephrotoxins, renally dose all medications --Monitor renal function closely daily  Elevated LFTs Etiology likely secondary to Covid-19 viral infection.  Right upper quadrant ultrasound unrevealing.  Acute hepatitis panel negative. --Continue to monitor LFTs   DVT prophylaxis: Lovenox Code Status: Full code Family Communication: Updated patient extensively at bedside  Disposition Plan:  Status is: Inpatient  Remains inpatient appropriate because:Unsafe d/c plan, IV treatments appropriate due to intensity of illness or inability to take PO and Inpatient level of care appropriate due to severity of illness   Dispo: The patient is from: Home              Anticipated d/c is to: SNF              Anticipated d/c date is: 2 days              Patient currently is not medically stable to d/c.   Consultants:   None  Procedures:   None  Antimicrobials:  Remdesivir 8/28 - 9/1   Subjective: Patient seen and examined bedside, resting comfortably in bed.  Continues with dyspnea even at rest, slightly improved.  Also reports continued significant weakness and fatigue.  Difficult even getting up from bed to chair.  No other complaints or concerns at this time.  Denies headache,  no dizziness, no chest pain, palpitations, no fever/chills/night sweats, no nausea/vomiting/diarrhea, no abdominal pain, no congestion.  No acute events overnight per nursing staff.  Objective: Vitals:   04/23/20 1450 04/23/20 1929 04/24/20 0338 04/24/20 1352  BP: (!) 137/94 136/85 136/78 (!) 158/82  Pulse: 97 78 64 99  Resp: 18 15 16 17   Temp: 98.1 F (36.7 C) (!) 97.5 F (36.4 C) 98.4 F (36.9 C) 98.3 F (36.8 C)  TempSrc: Oral Oral Oral Oral  SpO2: 94% 97% 98% 96%  Height:        Intake/Output Summary (Last 24 hours) at 04/24/2020 1422 Last data filed at 04/23/2020 1959 Gross per 24 hour  Intake 472 ml  Output 925 ml  Net -453 ml   There were no vitals filed for this visit.  Examination:  General exam: Appears calm and comfortable  Respiratory system: Clear to auscultation. Respiratory effort normal.  On 3 L nasal cannula with SPO2 98% at rest Cardiovascular system: S1 & S2 heard, RRR. No JVD, murmurs, rubs, gallops or clicks. No pedal edema. Gastrointestinal system: Abdomen is nondistended, soft and nontender. No organomegaly or masses felt. Normal bowel sounds heard. Central nervous system: Alert and oriented. No focal neurological deficits. Extremities: Symmetric 5 x 5 power. Skin: No rashes, lesions or ulcers Psychiatry: Judgement and insight appear normal. Mood & affect appropriate.     Data Reviewed: I have personally reviewed following labs and imaging studies  CBC: Recent Labs  Lab 04/20/20 1015 04/21/20 0500 04/22/20 0443 04/23/20 0413 04/24/20 0352  WBC 9.4 7.0 9.9 11.3* 8.8  NEUTROABS 8.1* 6.1 8.9* 10.2* 7.7  HGB 13.9 14.7 14.1 13.9 13.5  HCT 43.6 46.4* 44.5 42.8 42.5  MCV 95.2 97.1 96.7 93.7 95.7  PLT 224 250 334 419* 398   Basic Metabolic Panel: Recent Labs  Lab 04/20/20 1015 04/20/20 1015 04/20/20 1040 04/21/20 0500 04/22/20 0443 04/23/20 0413 04/24/20 0352  NA 141  --   --  141 138 138 135  K 3.7  --   --  3.9 3.5 3.7 3.9  CL 103  --    --  104 101 101 99  CO2 26  --   --  25 25 26 25   GLUCOSE 118*  --   --  124* 174* 165* 161*  BUN 23  --   --  25* 27* 41* 43*  CREATININE 0.91   < > 0.77 0.71 0.89 1.19* 1.19*  CALCIUM 9.0  --   --  9.1 8.8* 8.6* 8.4*   < > = values in this interval not displayed.   GFR: CrCl cannot be calculated (Unknown ideal weight.). Liver Function Tests: Recent Labs  Lab 04/20/20 1015 04/21/20 0500 04/22/20 0443 04/23/20 0413 04/24/20 0352  AST 102* 120* 32 22 23  ALT 123* 149* 98* 71* 56*  ALKPHOS 80 86 68 64 54  BILITOT 0.9 0.6 0.4 0.4 0.6  PROT 7.9 8.1 7.3 7.0 6.4*  ALBUMIN 3.4* 3.3* 3.1* 3.1* 3.0*   No results for input(s): LIPASE, AMYLASE in the last 168 hours. No results for input(s): AMMONIA in the last 168 hours. Coagulation Profile: No results for input(s): INR, PROTIME in the  last 168 hours. Cardiac Enzymes: No results for input(s): CKTOTAL, CKMB, CKMBINDEX, TROPONINI in the last 168 hours. BNP (last 3 results) No results for input(s): PROBNP in the last 8760 hours. HbA1C: Recent Labs    04/21/20 1756  HGBA1C 6.0*   CBG: No results for input(s): GLUCAP in the last 168 hours. Lipid Profile: Recent Labs    04/21/20 1756  CHOL 181  HDL 27*  LDLCALC 143*  TRIG 53  CHOLHDL 6.7   Thyroid Function Tests: No results for input(s): TSH, T4TOTAL, FREET4, T3FREE, THYROIDAB in the last 72 hours. Anemia Panel: Recent Labs    04/23/20 0413 04/24/20 0352  FERRITIN 343* 266   Sepsis Labs: Recent Labs  Lab 04/20/20 1228 04/20/20 1230  PROCALCITON  --  <0.10  LATICACIDVEN 1.3  --     Recent Results (from the past 240 hour(s))  SARS Coronavirus 2 by RT PCR (hospital order, performed in [facility] hospital lab) Nasopharyngeal Nasopharyngeal Swab     Status: Abnormal   Collection Time: 04/20/20 10:15 AM   Specimen: Nasopharyngeal Swab  Result Value Ref Range Status   SARS Coronavirus 2 POSITIVE (A) NEGATIVE Final    Comment: RESULT CALLED TO, READ BACK BY AND  VERIFIED WITH: PETRUCELL,S PA @1204  ON 04/20/20 JACKSON,K (NOTE) SARS-CoV-2 target nucleic acids are DETECTED  SARS-CoV-2 RNA is generally detectable in upper respiratory specimens  during the acute phase of infection.  Positive results are indicative  of the presence of the identified virus, but do not rule out bacterial infection or co-infection with other pathogens not detected by the test.  Clinical correlation with patient history and  other diagnostic information is necessary to determine patient infection status.  The expected result is negative.  Fact Sheet for Patients:   04/22/20   Fact Sheet for Healthcare Providers:   BoilerBrush.com.cy    This test is not yet approved or cleared by the https://pope.com/ FDA and  has been authorized for detection and/or diagnosis of SARS-CoV-2 by FDA under an Emergency Use Authorization (EUA).  This EUA will remain in effect (meaning  this test can be used) for the duration of  the COVID-19 declaration under Section 564(b)(1) of the Act, 21 U.S.C. section 360-bbb-3(b)(1), unless the authorization is terminated or revoked sooner.  Performed at [facility], [address] [place], [place]   Blood Culture (routine x 2)     Status: None (Preliminary result)   Collection Time: 04/20/20 12:18 PM   Specimen: BLOOD  Result Value Ref Range Status   Specimen Description   Final    BLOOD LEFT ANTECUBITAL Performed at [facility], [address], [place], [place]    Special Requests   Final    BOTTLES DRAWN AEROBIC AND ANAEROBIC Blood Culture results may not be optimal due to an inadequate volume of blood received in culture bottles Performed at [facility], [address] [place], [place]    Culture   Final    NO GROWTH 4 DAYS Performed at [facility], [address], [place], [place]     Report Status PENDING  Incomplete  Blood Culture (routine x 2)     Status: None (Preliminary result)   Collection Time: 04/20/20 12:30 PM   Specimen: BLOOD RIGHT HAND  Result Value Ref Range Status   Specimen Description   Final    BLOOD RIGHT HAND Performed at [facility], [address] [place], [place] [address]    Special Requests   Final    BOTTLES DRAWN AEROBIC AND ANAEROBIC Blood Culture results may not be optimal due to an inadequate volume of blood received in culture bottles Performed at [facility], [address], [place], [place] [address]    Culture   Final    NO GROWTH 4 DAYS Performed at [facility], [address], [place], [place] [address]    Report Status PENDING  Incomplete         Radiology Studies: No results found.      Scheduled Meds: . albuterol  2 puff Inhalation Q6H  . amLODipine  5 mg Oral Daily  . vitamin C  500 mg Oral Daily  . enoxaparin (LOVENOX) injection  40 mg Subcutaneous Q24H  . methylPREDNISolone (SOLU-MEDROL) injection  60 mg Intravenous Q12H  . pantoprazole  40 mg Oral Daily  . zinc sulfate  220 mg Oral Daily   Continuous Infusions:   LOS: 4 days    Time spent: 39 minutes spent on chart review, discussion with nursing staff, consultants, updating family and interview/physical exam; more than 50% of that time was spent in counseling and/or coordination of care.    Alvira Philips [place], DO [facility] Available via Epic secure chat 7am-7pm After these hours, please refer to coverage provider listed on [email or web address] 04/24/2020, 2:22 PM

## 2020-04-24 NOTE — NC FL2 (Signed)
[place] MEDICAID FL2 LEVEL OF CARE SCREENING TOOL     IDENTIFICATION  Patient Name: Molly Grant Birthdate: 1951/08/24 Sex: female Admission Date (Current Location): 04/20/2020  [facility] and [place] Number:  Producer, television/film/video and Address:  [facility]  [address], [place] [address]      Provider Number: [id number]  Attending Physician Name and Address:  [place], Eric J, DO  Relative Name and Phone Number:  Duwayne Heck [phone], daughter    Current Level of Care: Hospital Recommended Level of Care: Skilled Nursing Facility Prior Approval Number:    Date Approved/Denied:   PASRR Number: [id number]  Discharge Plan: SNF    Current Diagnoses: Patient Active Problem List   Diagnosis Date Noted  . Pneumonia due to COVID-19 virus 04/20/2020  . Hypertensive urgency 04/20/2020  . Elevated LFTs 04/20/2020    Orientation RESPIRATION BLADDER Height & Weight     Self, Situation, Place  O2 (3L ) External catheter Weight:   Height:  5\' 2"  (157.5 cm)  BEHAVIORAL SYMPTOMS/MOOD NEUROLOGICAL BOWEL NUTRITION STATUS   (none)  (none) Continent Diet (see d/c summary)  AMBULATORY STATUS COMMUNICATION OF NEEDS Skin   Extensive Assist Verbally Normal                       Personal Care Assistance Level of Assistance  Bathing, Feeding, Dressing Bathing Assistance: Maximum assistance Feeding assistance: Independent Dressing Assistance: Limited assistance     Functional Limitations Info  Sight, Hearing, Speech Sight Info: Adequate Hearing Info: Adequate Speech Info: Adequate    SPECIAL CARE FACTORS FREQUENCY  PT (By licensed PT), OT (By licensed OT)     PT Frequency: 5X/W OT Frequency: 5X/W            Contractures Contractures Info: Not present    Additional Factors Info  Code Status, Allergies Code Status Info: full Allergies Info: NKA           Current Medications (04/24/2020):  This is the current hospital active medication  list Current Facility-Administered Medications  Medication Dose Route Frequency Provider Last Rate Last Admin  . acetaminophen (TYLENOL) tablet 650 mg  650 mg Oral Q6H PRN 06/24/2020, MD   650 mg at 04/22/20 04/24/20   Or  . acetaminophen (TYLENOL) suppository 650 mg  650 mg Rectal Q6H PRN 6967, MD      . albuterol (VENTOLIN HFA) 108 (90 Base) MCG/ACT inhaler 2 puff  2 puff Inhalation Q6H Jerald Kief, MD   2 puff at 04/24/20 1438  . amLODipine (NORVASC) tablet 5 mg  5 mg Oral Daily Dahal, Binaya, MD   5 mg at 04/24/20 0854  . ascorbic acid (VITAMIN C) tablet 500 mg  500 mg Oral Daily 06/24/20, MD   500 mg at 04/24/20 06/24/20  . chlorpheniramine-HYDROcodone (TUSSIONEX) 10-8 MG/5ML suspension 5 mL  5 mL Oral Q12H PRN 8938, MD   5 mL at 04/23/20 1615  . enoxaparin (LOVENOX) injection 40 mg  40 mg Subcutaneous Q24H 04/25/20, MD   40 mg at 04/23/20 2213  . guaiFENesin-dextromethorphan (ROBITUSSIN DM) 100-10 MG/5ML syrup 10 mL  10 mL Oral Q4H PRN 2214, MD   10 mL at 04/24/20 1219  . hydrALAZINE (APRESOLINE) injection 5 mg  5 mg Intravenous Q4H PRN 1220, MD   5 mg at 04/21/20 1820  . methylPREDNISolone sodium succinate (SOLU-MEDROL) 125 mg/2 mL injection 60  mg  60 mg Intravenous Q12H Lorin Glass, MD   60 mg at 04/24/20 0853  . pantoprazole (PROTONIX) EC tablet 40 mg  40 mg Oral Daily Len Childs T, RPH   40 mg at 04/24/20 0854  . zinc sulfate capsule 220 mg  220 mg Oral Daily Jerald Kief, MD   220 mg at 04/24/20 2229     Discharge Medications: Please see discharge summary for a list of discharge medications.  Relevant Imaging Results:  Relevant Lab Results:   Additional Information [phone] [place], [place]

## 2020-04-25 LAB — CBC WITH DIFFERENTIAL/PLATELET
Abs Immature Granulocytes: 0.1 10*3/uL — ABNORMAL HIGH (ref 0.00–0.07)
Basophils Absolute: 0 10*3/uL (ref 0.0–0.1)
Basophils Relative: 0 %
Eosinophils Absolute: 0 10*3/uL (ref 0.0–0.5)
Eosinophils Relative: 0 %
HCT: 43 % (ref 36.0–46.0)
Hemoglobin: 13.7 g/dL (ref 12.0–15.0)
Immature Granulocytes: 1 %
Lymphocytes Relative: 7 %
Lymphs Abs: 0.6 10*3/uL — ABNORMAL LOW (ref 0.7–4.0)
MCH: 30.4 pg (ref 26.0–34.0)
MCHC: 31.9 g/dL (ref 30.0–36.0)
MCV: 95.3 fL (ref 80.0–100.0)
Monocytes Absolute: 0.2 10*3/uL (ref 0.1–1.0)
Monocytes Relative: 2 %
Neutro Abs: 7.5 10*3/uL (ref 1.7–7.7)
Neutrophils Relative %: 90 %
Platelets: 376 10*3/uL (ref 150–400)
RBC: 4.51 MIL/uL (ref 3.87–5.11)
RDW: 14 % (ref 11.5–15.5)
WBC: 8.4 10*3/uL (ref 4.0–10.5)
nRBC: 0 % (ref 0.0–0.2)

## 2020-04-25 LAB — COMPREHENSIVE METABOLIC PANEL
ALT: 54 U/L — ABNORMAL HIGH (ref 0–44)
AST: 29 U/L (ref 15–41)
Albumin: 2.9 g/dL — ABNORMAL LOW (ref 3.5–5.0)
Alkaline Phosphatase: 57 U/L (ref 38–126)
Anion gap: 12 (ref 5–15)
BUN: 36 mg/dL — ABNORMAL HIGH (ref 8–23)
CO2: 23 mmol/L (ref 22–32)
Calcium: 8.6 mg/dL — ABNORMAL LOW (ref 8.9–10.3)
Chloride: 103 mmol/L (ref 98–111)
Creatinine, Ser: 0.95 mg/dL (ref 0.44–1.00)
GFR calc Af Amer: >60 mL/min (ref >60)
GFR calc non Af Amer: >60 mL/min (ref >60)
Glucose, Bld: 159 mg/dL — ABNORMAL HIGH (ref 70–99)
Potassium: 4 mmol/L (ref 3.5–5.1)
Sodium: 138 mmol/L (ref 135–145)
Total Bilirubin: 0.7 mg/dL (ref 0.3–1.2)
Total Protein: 6.3 g/dL — ABNORMAL LOW (ref 6.5–8.1)

## 2020-04-25 LAB — D-DIMER, QUANTITATIVE: D-Dimer, Quant: 2.56 ug/mL-FEU — ABNORMAL HIGH (ref 0.00–0.50)

## 2020-04-25 LAB — CULTURE, BLOOD (ROUTINE X 2)
Culture: NO GROWTH
Culture: NO GROWTH

## 2020-04-25 LAB — C-REACTIVE PROTEIN: CRP: 0.9 mg/dL (ref <1.0)

## 2020-04-25 LAB — FERRITIN: Ferritin: 251 ng/mL (ref 11–307)

## 2020-04-25 MED ORDER — ALBUTEROL SULFATE HFA 108 (90 BASE) MCG/ACT IN AERS: 2.0000 | INHALATION_SPRAY | Freq: Four times a day (QID) | RESPIRATORY_TRACT | 0 refills | Status: AC

## 2020-04-25 MED ORDER — HYDROCOD POLST-CPM POLST ER 10-8 MG/5ML PO SUER
5.0000 mL | Freq: Two times a day (BID) | ORAL | 0 refills | Status: AC | PRN
Start: 2020-04-25 — End: 2020-05-07

## 2020-04-25 MED ORDER — PANTOPRAZOLE SODIUM 40 MG PO TBEC: 40.0000 mg | DELAYED_RELEASE_TABLET | Freq: Every day | ORAL | 0 refills | Status: AC

## 2020-04-25 MED ORDER — AMLODIPINE BESYLATE 5 MG PO TABS: 5.0000 mg | ORAL_TABLET | Freq: Every day | ORAL | 0 refills | Status: AC

## 2020-04-25 MED ORDER — ASCORBIC ACID 500 MG PO TABS: 500.0000 mg | ORAL_TABLET | Freq: Every day | ORAL | 0 refills | Status: AC

## 2020-04-25 MED ORDER — ZINC SULFATE 220 (50 ZN) MG PO CAPS: 220.0000 mg | ORAL_CAPSULE | Freq: Every day | ORAL | 0 refills | Status: AC

## 2020-04-25 MED ORDER — PREDNISONE 10 MG PO TABS: ORAL_TABLET | ORAL | 0 refills | Status: AC

## 2020-04-25 NOTE — Progress Notes (Signed)
Report called to [facility] and given to [facility].

## 2020-04-25 NOTE — Discharge Summary (Signed)
Physician Discharge Summary  Trish FountainYvonne F Fluty WUJ:[id number] DOB: 11/12/1950 DOA: 04/20/2020  PCP: Patient, No Pcp Per  Admit date: 04/20/2020 Discharge date: 04/25/2020  Admitted From: Home Disposition: SNF  Recommendations for Outpatient Follow-up:  1. Follow up with PCP in 1-2 weeks 2. Continue prednisone taper on discharge 3. Continue supplemental oxygen, on 2.5 L nasal cannula at time of discharge, continue to titrate off if able as she is not on home oxygen. 4. Continue supportive care with zinc, vitamin C, albuterol MDI as needed, Tussionex, Tylenol 5. Encourage Covid-19 vaccination once completes quarantine period.  Home Health: No Equipment/Devices: Oxygen, 2.5 L nasal cannula  Discharge Condition: Stable CODE STATUS: Full code Diet recommendation: Regular diet  History of present illness:  Trish FountainYvonne F Naim is a 69 y.o.femalewith no known past medical history who  presented to the ED on8/28/2021with 1 week history of progressive shortness of breath associated with cough.  EMS noted oxygen saturation to be in 80s on room air and brought to the ED. Of note, patient is not vaccinated against COVID-19.  In the ED, patient was afebrile, heart rate 102, respiratory 33, oxygen saturation 87% on room air improved to more than 90% on 3 L by nasal cannula. Blood pressure 189/105. COVID-19 PCR positive. Labs with normal CBC, normal procalcitonin, normal lactic acid, CRP elevated to 16.2, ferritin elevated to 691, LDH elevated to 310, D-dimer 2.28. BMP normal, AST/ALT elevated to 102/123. Chest x-ray showed bibasilar infiltrates. Patient was admitted to hospitalist service for further evaluation management.  Hospital course:  Acute hypoxic respiratory failure secondary to acute Covid-19 viral pneumonia during the ongoing 2020/2021 Covid 19 Pandemic - POA Patient presented with progressive shortness of breath and cough.  Chest x-ray notable for by basal infiltrates consistent with  multifocal viral pneumonia.  Positive Covid-19 PCR on 04/20/2020.  Patient completed 5-day course of remdesivir and was started on IV steroids which was weaned down during hospitalization with improvement of her inflammatory markers.  Patient was titrated down on her oxygen requirements and will discharge on 2.5 L nasal cannula.  Also continue steroid taper on discharge with prednisone.  Continue to titrate supplemental oxygen as able; as patient is not on home oxygen at baseline.  Continue supportive care with incentive spirometry, flutter valve, zinc, vitamin C, Tussionex.  Encourage Covid-19 vaccination after completes isolation period.   The treatment plan and use of medications and known side effects were discussed with patient/family. Some of the medications used are based on case reports/anecdotal data.  All other medications being used in the management of COVID-19 based on limited study data.  Complete risks and long-term side effects are unknown, however in the best clinical judgment they seem to be of some benefit.  Patient wanted to proceed with treatment options provided.  Essential hypertension Started on amlodipine 5 mg p.o. daily during hospitalization for hypertensive urgency on admission.  Acute renal failure: Resolved Creatinine up to 1.19 with a baseline 0.7.  Creatinine time of discharge 0.95.  Elevated LFTs: Improving Etiology likely secondary to Covid-19 viral infection.  Right upper quadrant ultrasound unrevealing.  Acute hepatitis panel negative.  Repeat CMP in 1 week.  Discharge Diagnoses:  Active Problems:   Pneumonia due to COVID-19 virus   Elevated LFTs    Discharge Instructions  Discharge Instructions    Call MD for:  difficulty breathing, headache or visual disturbances   Complete by: As directed    Call MD for:  extreme fatigue   Complete by: As directed  Call MD for:  persistant dizziness or light-headedness   Complete by: As directed    Call MD for:   persistant nausea and vomiting   Complete by: As directed    Call MD for:  severe uncontrolled pain   Complete by: As directed    Call MD for:  temperature >100.4   Complete by: As directed    Diet - low sodium heart healthy   Complete by: As directed    Increase activity slowly   Complete by: As directed      Allergies as of 04/25/2020   No Known Allergies     Medication List    TAKE these medications   albuterol 108 (90 Base) MCG/ACT inhaler Commonly known as: VENTOLIN HFA Inhale 2 puffs into the lungs every 6 (six) hours.   amLODipine 5 MG tablet Commonly known as: NORVASC Take 1 tablet (5 mg total) by mouth daily. Start taking on: April 26, 2020   ascorbic acid 500 MG tablet Commonly known as: VITAMIN C Take 1 tablet (500 mg total) by mouth daily. Start taking on: April 26, 2020   chlorpheniramine-HYDROcodone 10-8 MG/5ML Suer Commonly known as: TUSSIONEX Take 5 mLs by mouth every 12 (twelve) hours as needed for up to 12 days for cough.   multivitamin with minerals Tabs tablet Take 1 tablet by mouth daily.   pantoprazole 40 MG tablet Commonly known as: PROTONIX Take 1 tablet (40 mg total) by mouth daily. Start taking on: April 26, 2020   predniSONE 10 MG tablet Commonly known as: DELTASONE Take 4 tablets (40 mg total) by mouth daily for 3 days, THEN 3 tablets (30 mg total) daily for 3 days, THEN 2 tablets (20 mg total) daily for 3 days, THEN 1 tablet (10 mg total) daily for 3 days. Start taking on: April 26, 2020   zinc sulfate 220 (50 Zn) MG capsule Take 1 capsule (220 mg total) by mouth daily. Start taking on: April 26, 2020       No Known Allergies  Consultations:  None   Procedures/Studies: DG Chest Portable 1 View  Result Date: 04/20/2020 CLINICAL DATA:  Shortness of breath.  Cough. EXAM: PORTABLE CHEST 1 VIEW COMPARISON:  None. FINDINGS: Bibasilar infiltrates are identified. The heart, hila, mediastinum, lungs, and pleura are  otherwise unremarkable. IMPRESSION: Bibasilar infiltrates concerning for multifocal pneumonia. Recommend clinical correlation and follow-up to resolution. Electronically Signed   By: Gerome Sam III M.D   On: 04/20/2020 11:22   US Abdomen Limited RUQ  Result Date: 04/21/2020 CLINICAL DATA:  Elevated liver enzymes EXAM: ULTRASOUND ABDOMEN LIMITED RIGHT UPPER QUADRANT COMPARISON:  None. FINDINGS: Gallbladder: No gallstones or wall thickening visualized. No sonographic Murphy sign noted by sonographer. Common bile duct: Diameter: 6 mm Liver: No focal lesion identified. Within normal limits parenchymal echogenicity. Portal vein is patent on color Doppler imaging with normal direction of blood flow towards the liver. Other: None. IMPRESSION: No findings to explain elevated liver enzymes. Electronically Signed   By: Romona Curls M.D.   On: 04/21/2020 11:21      Subjective: Patient seen and examined bedside, resting comfortably.  Requesting something different to eat as meat/dairy products cause increased mucus production.  Ready for discharge to rehab in order to return home as soon as possible.  No other complaints or concerns at this time.  Denies headache, no dizziness, no chest pain, palpitations, no abdominal pain, no congestion, no fever/chills/night sweats, no nausea/vomiting/diarrhea.  No acute events overnight per nursing staff.  Discharge Exam: Vitals:   04/24/20 2116 04/25/20 0420  BP: (!) 150/86 (!) 155/75  Pulse: 66 70  Resp: 18 18  Temp: 97.8 F (36.6 C) 98.2 F (36.8 C)  SpO2: 99% 94%   Vitals:   04/24/20 0338 04/24/20 1352 04/24/20 2116 04/25/20 0420  BP: 136/78 (!) 158/82 (!) 150/86 (!) 155/75  Pulse: 64 99 66 70  Resp: 16 17 18 18   Temp: 98.4 F (36.9 C) 98.3 F (36.8 C) 97.8 F (36.6 C) 98.2 F (36.8 C)  TempSrc: Oral Oral    SpO2: 98% 96% 99% 94%  Height:        General: Pt is alert, awake, not in acute distress Cardiovascular: RRR, S1/S2 +, no rubs, no  gallops Respiratory: CTA bilaterally, no wheezing, no rhonchi, on 2.5 L [place] with SPO2 94% at rest Abdominal: Soft, NT, ND, bowel sounds + Extremities: no edema, no cyanosis    The results of significant diagnostics from this hospitalization (including imaging, microbiology, ancillary and laboratory) are listed below for reference.     Microbiology: Recent Results (from the past 240 hour(s))  SARS Coronavirus 2 by RT PCR (hospital order, performed in [facility] hospital lab) Nasopharyngeal Nasopharyngeal Swab     Status: Abnormal   Collection Time: 04/20/20 10:15 AM   Specimen: Nasopharyngeal Swab  Result Value Ref Range Status   SARS Coronavirus 2 POSITIVE (A) NEGATIVE Final    Comment: RESULT CALLED TO, READ BACK BY AND VERIFIED WITH: PETRUCELL,S PA @1204  ON 04/20/20 JACKSON,K (NOTE) SARS-CoV-2 target nucleic acids are DETECTED  SARS-CoV-2 RNA is generally detectable in upper respiratory specimens  during the acute phase of infection.  Positive results are indicative  of the presence of the identified virus, but do not rule out bacterial infection or co-infection with other pathogens not detected by the test.  Clinical correlation with patient history and  other diagnostic information is necessary to determine patient infection status.  The expected result is negative.  Fact Sheet for Patients:     Fact Sheet for Healthcare Providers:   04/22/20    This test is not yet approved or cleared by the [employer] FDA and  has been authorized for detection and/or diagnosis of SARS-CoV-2 by FDA under an Emergency Use Authorization (EUA).  This EUA will remain in effect (meaning  this test can be used) for the duration of  the COVID-19 declaration under Section 564(b)(1) of the Act, 21 U.S.C. section 360-bbb-3(b)(1), unless the authorization is terminated or revoked sooner.  Performed at [facility], [address] [place], [place]   Blood Culture (routine x 2)     Status: None   Collection Time: 04/20/20 12:18 PM   Specimen: BLOOD  Result Value Ref Range Status   Specimen Description   Final    BLOOD LEFT ANTECUBITAL Performed at [facility], [address], [place], [address] [place]    Special Requests   Final    BOTTLES DRAWN AEROBIC AND ANAEROBIC Blood Culture results may not be optimal due to an inadequate volume of blood received in culture bottles Performed at [facility], [address], [place], [place]    Culture   Final    NO GROWTH 5 DAYS Performed at [facility], [address] [place], [address] [place]    Report Status 04/25/2020 FINAL  Final  Blood Culture (routine x 2)     Status: None   Collection  Time: 04/20/20 12:30 PM   Specimen: BLOOD RIGHT HAND  Result Value Ref Range Status   Specimen Description   Final    BLOOD RIGHT HAND Performed at [facility], [address], [place], [place] [address]    Special Requests   Final    BOTTLES DRAWN AEROBIC AND ANAEROBIC Blood Culture results may not be optimal due to an inadequate volume of blood received in culture bottles Performed at [facility], [address] [place], [place] [address]    Culture   Final    NO GROWTH 5 DAYS Performed at [facility], [address], [place], [place] [address]    Report Status 04/25/2020 FINAL  Final     Labs: BNP (last 3 results) No results for input(s): BNP in the last 8760 hours. Basic Metabolic Panel: Recent Labs  Lab 04/21/20 0500 04/22/20 0443 04/23/20 0413 04/24/20 0352 04/25/20 0418  NA 141 138 138 135 138  K 3.9 3.5 3.7 3.9 4.0  CL 104 101 101 99 103  CO2 25 25 26 25 23   GLUCOSE 124* 174* 165* 161* 159*  BUN 25* 27* 41* 43* 36*  CREATININE 0.71 0.89 1.19* 1.19* 0.95  CALCIUM 9.1 8.8* 8.6* 8.4* 8.6*   Liver Function  Tests: Recent Labs  Lab 04/21/20 0500 04/22/20 0443 04/23/20 0413 04/24/20 0352 04/25/20 0418  AST 120* 32 22 23 29   ALT 149* 98* 71* 56* 54*  ALKPHOS 86 68 64 54 57  BILITOT 0.6 0.4 0.4 0.6 0.7  PROT 8.1 7.3 7.0 6.4* 6.3*  ALBUMIN 3.3* 3.1* 3.1* 3.0* 2.9*   No results for input(s): LIPASE, AMYLASE in the last 168 hours. No results for input(s): AMMONIA in the last 168 hours. CBC: Recent Labs  Lab 04/21/20 0500 04/22/20 0443 04/23/20 0413 04/24/20 0352 04/25/20 0418  WBC 7.0 9.9 11.3* 8.8 8.4  NEUTROABS 6.1 8.9* 10.2* 7.7 7.5  HGB 14.7 14.1 13.9 13.5 13.7  HCT 46.4* 44.5 42.8 42.5 43.0  MCV 97.1 96.7 93.7 95.7 95.3  PLT 250 334 419* 398 376   Cardiac Enzymes: No results for input(s): CKTOTAL, CKMB, CKMBINDEX, TROPONINI in the last 168 hours. BNP: Invalid input(s): POCBNP CBG: No results for input(s): GLUCAP in the last 168 hours. D-Dimer Recent Labs    04/24/20 0352 04/25/20 0418  DDIMER 2.31* 2.56*   Hgb A1c No results for input(s): HGBA1C in the last 72 hours. Lipid Profile No results for input(s): CHOL, HDL, LDLCALC, TRIG, CHOLHDL, LDLDIRECT in the last 72 hours. Thyroid function studies No results for input(s): TSH, T4TOTAL, T3FREE, THYROIDAB in the last 72 hours.  Invalid input(s): FREET3 Anemia work up 06/24/20    04/24/20 0352 04/25/20 0418  FERRITIN 266 251   Urinalysis No results found for: COLORURINE, APPEARANCEUR, LABSPEC, PHURINE, GLUCOSEU, HGBUR, BILIRUBINUR, KETONESUR, PROTEINUR, UROBILINOGEN, NITRITE, LEUKOCYTESUR Sepsis Labs Invalid input(s): PROCALCITONIN,  WBC,  LACTICIDVEN Microbiology Recent Results (from the past 240 hour(s))  SARS Coronavirus 2 by RT PCR (hospital order, performed in [facility] hospital lab) Nasopharyngeal Nasopharyngeal Swab     Status: Abnormal   Collection Time: 04/20/20 10:15 AM   Specimen: Nasopharyngeal Swab  Result Value Ref Range Status   SARS Coronavirus 2 POSITIVE (A) NEGATIVE Final    Comment:  RESULT CALLED TO, READ BACK BY AND VERIFIED WITH: PETRUCELL,S PA @1204  ON 04/20/20 JACKSON,K (NOTE) SARS-CoV-2 target nucleic acids are DETECTED  SARS-CoV-2 RNA is generally detectable in upper respiratory specimens  during the acute  phase of infection.  Positive results are indicative  of the presence of the identified virus, but do not rule out bacterial infection or co-infection with other pathogens not detected by the test.  Clinical correlation with patient history and  other diagnostic information is necessary to determine patient infection status.  The expected result is negative.  Fact Sheet for Patients:   [employer]   Fact Sheet for Healthcare Providers:   https://pope.com/    This test is not yet approved or cleared by the [place] FDA and  has been authorized for detection and/or diagnosis of SARS-CoV-2 by FDA under an Emergency Use Authorization (EUA).  This EUA will remain in effect (meaning  this test can be used) for the duration of  the COVID-19 declaration under Section 564(b)(1) of the Act, 21 U.S.C. section 360-bbb-3(b)(1), unless the authorization is terminated or revoked sooner.  Performed at [facility], [address], [place], [place] [address]   Blood Culture (routine x 2)     Status: None   Collection Time: 04/20/20 12:18 PM   Specimen: BLOOD  Result Value Ref Range Status   Specimen Description   Final    BLOOD LEFT ANTECUBITAL Performed at [facility], [address], [place], [place] [address]    Special Requests   Final    BOTTLES DRAWN AEROBIC AND ANAEROBIC Blood Culture results may not be optimal due to an inadequate volume of blood received in culture bottles Performed at [facility], [address] [place], [place] [address]    Culture   Final    NO GROWTH 5 DAYS Performed at [facility], [address] [place], [place]  [address]    Report Status 04/25/2020 FINAL  Final  Blood Culture (routine x 2)     Status: None   Collection Time: 04/20/20 12:30 PM   Specimen: BLOOD RIGHT HAND  Result Value Ref Range Status   Specimen Description   Final    BLOOD RIGHT HAND Performed at [facility], [address] [place], [place] [address]    Special Requests   Final    BOTTLES DRAWN AEROBIC AND ANAEROBIC Blood Culture results may not be optimal due to an inadequate volume of blood received in culture bottles Performed at [facility], [address], [place], [place] [address]    Culture   Final    NO GROWTH 5 DAYS Performed at [facility], [address] [place], [place] [address]    Report Status 04/25/2020 FINAL  Final     Time coordinating discharge: Over 30 minutes  SIGNED:   Alvira Philips [place], DO  [facility] 04/25/2020, 10:31 AM

## 2020-04-25 NOTE — TOC Transition Note (Signed)
Transition of Care [facility]) - CM/SW Discharge Note   Patient Details  Name: Molly Grant MRN: [id number] Date of Birth: 04-Jun-1951  Transition of Care [facility]) CM/SW Contact:  Ida Rogue, LCSW Phone Number: 04/25/2020, 10:59 AM   Clinical Narrative:  Patient to transfer to [facility] today.  PTAR arranged.  Authorization by [employer]: reference [id number]. authed thru 9/7. Harle [place] is reviewer.  FAX [phone].  Family alerted.  Nursing, please call report to [phone]. TOC sign off.    Final next level of care: Skilled Nursing Facility Barriers to Discharge: Barriers Resolved   Patient Goals and CMS Choice     Choice offered to / list presented to : Adult Children  Discharge Placement                       Discharge Plan and Services   Discharge Planning Services: CM Consult Post Acute Care Choice: Skilled Nursing Facility                               Social Determinants of Health (SDOH) Interventions     Readmission Risk Interventions No flowsheet data found.

## 2020-04-25 NOTE — Discharge Instructions (Signed)
COVID-19 Frequently Asked Questions COVID-19 (coronavirus disease) is an infection that is caused by a large family of viruses. Some viruses cause illness in people and others cause illness in animals like camels, cats, and bats. In some cases, the viruses that cause illness in animals can spread to humans. Where did the coronavirus come from? In December 2019, [place] told the World Health Organization (WHO) of several cases of lung disease (human respiratory illness). These cases were linked to an open seafood and livestock market in the city of [place]. The link to the seafood and livestock market suggests that the virus may have spread from animals to humans. However, since that first outbreak in December, the virus has also been shown to spread from person to person. What is the name of the disease and the virus? Disease name Early on, this disease was called novel coronavirus. This is because scientists determined that the disease was caused by a new (novel) respiratory virus. The World Health Organization (WHO) has now named the disease COVID-19, or coronavirus disease. Virus name The virus that causes the disease is called severe acute respiratory syndrome coronavirus 2 (SARS-CoV-2). More information on disease and virus naming World Health Organization (WHO): www.who.int/emergencies/diseases/novel-coronavirus-2019/technical-guidance/naming-the-coronavirus-disease-(covid-2019)-and-the-virus-that-causes-it Who is at risk for complications from coronavirus disease? Some people may be at higher risk for complications from coronavirus disease. This includes older adults and people who have chronic diseases, such as heart disease, diabetes, and lung disease. If you are at higher risk for complications, take these extra precautions:  Stay home as much as possible.  Avoid social gatherings and travel.  Avoid close contact with others. Stay at least 6 ft (2 m) away from others, if possible.  Wash  your hands often with soap and water for at least 20 seconds.  Avoid touching your face, mouth, nose, or eyes.  Keep supplies on hand at home, such as food, medicine, and cleaning supplies.  If you must go out in public, wear a cloth face covering or face mask. Make sure your mask covers your nose and mouth. How does coronavirus disease spread? The virus that causes coronavirus disease spreads easily from person to person (is contagious). You may catch the virus by:  Breathing in droplets from an infected person. Droplets can be spread by a person breathing, speaking, singing, coughing, or sneezing.  Touching something, like a table or a doorknob, that was exposed to the virus (contaminated) and then touching your mouth, nose, or eyes. Can I get the virus from touching surfaces or objects? There is still a lot that we do not know about the virus that causes coronavirus disease. Scientists are basing a lot of information on what they know about similar viruses, such as:  Viruses cannot generally survive on surfaces for long. They need a human body (host) to survive.  It is more likely that the virus is spread by close contact with people who are sick (direct contact), such as through: ? Shaking hands or hugging. ? Breathing in respiratory droplets that travel through the air. Droplets can be spread by a person breathing, speaking, singing, coughing, or sneezing.  It is less likely that the virus is spread when a person touches a surface or object that has the virus on it (indirect contact). The virus may be able to enter the body if the person touches a surface or object and then touches his or her face, eyes, nose, or mouth. Can a person spread the virus without having symptoms of the disease?   It may be possible for the virus to spread before a person has symptoms of the disease, but this is most likely not the main way the virus is spreading. It is more likely for the virus to spread by  being in close contact with people who are sick and breathing in the respiratory droplets spread by a person breathing, speaking, singing, coughing, or sneezing. What are the symptoms of coronavirus disease? Symptoms vary from person to person and can range from mild to severe. Symptoms may include:  Fever or chills.  Cough.  Difficulty breathing or feeling short of breath.  Headaches, body aches, or muscle aches.  Runny or stuffy (congested) nose.  Sore throat.  New loss of taste or smell.  Nausea, vomiting, or diarrhea. These symptoms can appear anywhere from 2 to 14 days after you have been exposed to the virus. Some people may not have any symptoms. If you develop symptoms, call your health care provider. People with severe symptoms may need hospital care. Should I be tested for this virus? Your health care provider will decide whether to test you based on your symptoms, history of exposure, and your risk factors. How does a health care provider test for this virus? Health care providers will collect samples to send for testing. Samples may include:  Taking a swab of fluid from the back of your nose and throat, your nose, or your throat.  Taking fluid from the lungs by having you cough up mucus (sputum) into a sterile cup.  Taking a blood sample. Is there a treatment or vaccine for this virus? Currently, there is no vaccine to prevent coronavirus disease. Also, there are no medicines like antibiotics or antivirals to treat the virus. A person who becomes sick is given supportive care, which means rest and fluids. A person may also relieve his or her symptoms by using over-the-counter medicines that treat sneezing, coughing, and runny nose. These are the same medicines that a person takes for the common cold. If you develop symptoms, call your health care provider. People with severe symptoms may need hospital care. What can I do to protect myself and my family from this  virus?     You can protect yourself and your family by taking the same actions that you would take to prevent the spread of other viruses. Take the following actions:  Wash your hands often with soap and water for at least 20 seconds. If soap and water are not available, use alcohol-based hand sanitizer.  Avoid touching your face, mouth, nose, or eyes.  Cough or sneeze into a tissue, sleeve, or elbow. Do not cough or sneeze into your hand or the air. ? If you cough or sneeze into a tissue, throw it away immediately and wash your hands.  Disinfect objects and surfaces that you frequently touch every day.  Stay away from people who are sick.  Avoid going out in public, follow guidance from your state and local health authorities.  Avoid crowded indoor spaces. Stay at least 6 ft (2 m) away from others.  If you must go out in public, wear a cloth face covering or face mask. Make sure your mask covers your nose and mouth.  Stay home if you are sick, except to get medical care. Call your health care provider before you get medical care. Your health care provider will tell you how long to stay home.  Make sure your vaccines are up to date. Ask your health care provider what   vaccines you need. What should I do if I need to travel? Follow travel recommendations from your local health authority, the CDC, and WHO. Travel information and advice  Centers for Disease Control and Prevention (CDC): www.cdc.gov/coronavirus/2019-ncov/travelers/index.html  World Health Organization (WHO): www.who.int/emergencies/diseases/novel-coronavirus-2019/travel-advice Know the risks and take action to protect your health  You are at higher risk of getting coronavirus disease if you are traveling to areas with an outbreak or if you are exposed to travelers from areas with an outbreak.  Wash your hands often and practice good hygiene to lower the risk of catching or spreading the virus. What should I do if I  am sick? General instructions to stop the spread of infection  Wash your hands often with soap and water for at least 20 seconds. If soap and water are not available, use alcohol-based hand sanitizer.  Cough or sneeze into a tissue, sleeve, or elbow. Do not cough or sneeze into your hand or the air.  If you cough or sneeze into a tissue, throw it away immediately and wash your hands.  Stay home unless you must get medical care. Call your health care provider or local health authority before you get medical care.  Avoid public areas. Do not take public transportation, if possible.  If you can, wear a mask if you must go out of the house or if you are in close contact with someone who is not sick. Make sure your mask covers your nose and mouth. Keep your home clean  Disinfect objects and surfaces that are frequently touched every day. This may include: ? Counters and tables. ? Doorknobs and light switches. ? Sinks and faucets. ? Electronics such as phones, remote controls, keyboards, computers, and tablets.  Wash dishes in hot, soapy water or use a dishwasher. Air-dry your dishes.  Wash laundry in hot water. Prevent infecting other household members  Let healthy household members care for children and pets, if possible. If you have to care for children or pets, wash your hands often and wear a mask.  Sleep in a different bedroom or bed, if possible.  Do not share personal items, such as razors, toothbrushes, deodorant, combs, brushes, towels, and washcloths. Where to find more information Centers for Disease Control and Prevention (CDC)  Information and news updates: www.cdc.gov/coronavirus/2019-ncov World Health Organization (WHO)  Information and news updates: www.who.int/emergencies/diseases/novel-coronavirus-2019  Coronavirus health topic: www.who.int/health-topics/coronavirus  Questions and answers on COVID-19: www.who.int/news-room/q-a-detail/q-a-coronaviruses  Global  tracker: who.sprinklr.com American Academy of Pediatrics (AAP)  Information for families: www.healthychildren.org/English/health-issues/conditions/chest-lungs/Pages/2019-Novel-Coronavirus.aspx The coronavirus situation is changing rapidly. Check your local health authority website or the CDC and WHO websites for updates and news. When should I contact a health care provider?  Contact your health care provider if you have symptoms of an infection, such as fever or cough, and you: ? Have been near anyone who is known to have coronavirus disease. ? Have come into contact with a person who is suspected to have coronavirus disease. ? Have traveled to an area where there is an outbreak of COVID-19. When should I get emergency medical care?  Get help right away by calling your local emergency services (911 in the [place] if you have: ? Trouble breathing. ? Pain or pressure in your chest. ? Confusion. ? Blue-tinged lips and fingernails. ? Difficulty waking from sleep. ? Symptoms that get worse. Let the emergency medical personnel know if you think you have coronavirus disease. Summary  A new respiratory virus is spreading from person to person and causing   COVID-19 (coronavirus disease).  The virus that causes COVID-19 appears to spread easily. It spreads from one person to another through droplets from breathing, speaking, singing, coughing, or sneezing.  Older adults and those with chronic diseases are at higher risk of disease. If you are at higher risk for complications, take extra precautions.  There is currently no vaccine to prevent coronavirus disease. There are no medicines, such as antibiotics or antivirals, to treat the virus.  You can protect yourself and your family by washing your hands often, avoiding touching your face, and covering your coughs and sneezes. This information is not intended to replace advice given to you by your health care provider. Make sure you discuss any  questions you have with your health care provider. Document Revised: 06/09/2019 Document Reviewed: 12/06/2018 Elsevier Patient Education  [employer] Can Do to Manage Your COVID-19 Symptoms at Home If you have possible or confirmed COVID-19: 1. Stay home from work and school. And stay away from other public places. If you must go out, avoid using any kind of public transportation, ridesharing, or taxis. 2. Monitor your symptoms carefully. If your symptoms get worse, call your healthcare provider immediately. 3. Get rest and stay hydrated. 4. If you have a medical appointment, call the healthcare provider ahead of time and tell them that you have or may have COVID-19. 5. For medical emergencies, call 911 and notify the dispatch personnel that you have or may have COVID-19. 6. Cover your cough and sneezes with a tissue or use the inside of your elbow. 7. Wash your hands often with soap and water for at least 20 seconds or clean your hands with an alcohol-based hand sanitizer that contains at least 60% alcohol. 8. As much as possible, stay in a specific room and away from other people in your home. Also, you should use a separate bathroom, if available. If you need to be around other people in or outside of the home, wear a mask. 9. Avoid sharing personal items with other people in your household, like dishes, towels, and bedding. 10. Clean all surfaces that are touched often, like counters, tabletops, and doorknobs. Use household cleaning sprays or wipes according to the label instructions. [email or web address] 02/22/2019 This information is not intended to replace advice given to you by your health care provider. Make sure you discuss any questions you have with your health care provider. Document Revised: 07/27/2019 Document Reviewed: 07/27/2019 Elsevier Patient Education  2020 [employer].   COVID-19 COVID-19 is a respiratory infection that is caused by a virus called  severe acute respiratory syndrome coronavirus 2 (SARS-CoV-2). The disease is also known as coronavirus disease or novel coronavirus. In some people, the virus may not cause any symptoms. In others, it may cause a serious infection. The infection can get worse quickly and can lead to complications, such as:  Pneumonia, or infection of the lungs.  Acute respiratory distress syndrome or ARDS. This is a condition in which fluid build-up in the lungs prevents the lungs from filling with air and passing oxygen into the blood.  Acute respiratory failure. This is a condition in which there is not enough oxygen passing from the lungs to the body or when carbon dioxide is not passing from the lungs out of the body.  Sepsis or septic shock. This is a serious bodily reaction to an infection.  Blood clotting problems.  Secondary infections due to bacteria or fungus.  Organ failure. This is when your  body's organs stop working. The virus that causes COVID-19 is contagious. This means that it can spread from person to person through droplets from coughs and sneezes (respiratory secretions). What are the causes? This illness is caused by a virus. You may catch the virus by:  Breathing in droplets from an infected person. Droplets can be spread by a person breathing, speaking, singing, coughing, or sneezing.  Touching something, like a table or a doorknob, that was exposed to the virus (contaminated) and then touching your mouth, nose, or eyes. What increases the risk? Risk for infection You are more likely to be infected with this virus if you:  Are within 6 feet (2 meters) of a person with COVID-19.  Provide care for or live with a person who is infected with COVID-19.  Spend time in crowded indoor spaces or live in shared housing. Risk for serious illness You are more likely to become seriously ill from the virus if you:  Are 5 years of age or older. The higher your age, the more you are at risk  for serious illness.  Live in a nursing home or long-term care facility.  Have cancer.  Have a long-term (chronic) disease such as: ? Chronic lung disease, including chronic obstructive pulmonary disease or asthma. ? A long-term disease that lowers your body's ability to fight infection (immunocompromised). ? Heart disease, including heart failure, a condition in which the arteries that lead to the heart become narrow or blocked (coronary artery disease), a disease which makes the heart muscle thick, weak, or stiff (cardiomyopathy). ? Diabetes. ? Chronic kidney disease. ? Sickle cell disease, a condition in which red blood cells have an abnormal "sickle" shape. ? Liver disease.  Are obese. What are the signs or symptoms? Symptoms of this condition can range from mild to severe. Symptoms may appear any time from 2 to 14 days after being exposed to the virus. They include:  A fever or chills.  A cough.  Difficulty breathing.  Headaches, body aches, or muscle aches.  Runny or stuffy (congested) nose.  A sore throat.  New loss of taste or smell. Some people may also have stomach problems, such as nausea, vomiting, or diarrhea. Other people may not have any symptoms of COVID-19. How is this diagnosed? This condition may be diagnosed based on:  Your signs and symptoms, especially if: ? You live in an area with a COVID-19 outbreak. ? You recently traveled to or from an area where the virus is common. ? You provide care for or live with a person who was diagnosed with COVID-19. ? You were exposed to a person who was diagnosed with COVID-19.  A physical exam.  Lab tests, which may include: ? Taking a sample of fluid from the back of your nose and throat (nasopharyngeal fluid), your nose, or your throat using a swab. ? A sample of mucus from your lungs (sputum). ? Blood tests.  Imaging tests, which may include, X-rays, CT scan, or ultrasound. How is this treated? At present,  there is no medicine to treat COVID-19. Medicines that treat other diseases are being used on a trial basis to see if they are effective against COVID-19. Your health care provider will talk with you about ways to treat your symptoms. For most people, the infection is mild and can be managed at home with rest, fluids, and over-the-counter medicines. Treatment for a serious infection usually takes places in a hospital intensive care unit (ICU). It may include one or  more of the following treatments. These treatments are given until your symptoms improve.  Receiving fluids and medicines through an IV.  Supplemental oxygen. Extra oxygen is given through a tube in the nose, a face mask, or a hood.  Positioning you to lie on your stomach (prone position). This makes it easier for oxygen to get into the lungs.  Continuous positive airway pressure (CPAP) or bi-level positive airway pressure (BPAP) machine. This treatment uses mild air pressure to keep the airways open. A tube that is connected to a motor delivers oxygen to the body.  Ventilator. This treatment moves air into and out of the lungs by using a tube that is placed in your windpipe.  Tracheostomy. This is a procedure to create a hole in the neck so that a breathing tube can be inserted.  Extracorporeal membrane oxygenation (ECMO). This procedure gives the lungs a chance to recover by taking over the functions of the heart and lungs. It supplies oxygen to the body and removes carbon dioxide. Follow these instructions at home: Lifestyle  If you are sick, stay home except to get medical care. Your health care provider will tell you how long to stay home. Call your health care provider before you go for medical care.  Rest at home as told by your health care provider.  Do not use any products that contain nicotine or tobacco, such as cigarettes, e-cigarettes, and chewing tobacco. If you need help quitting, ask your health care  provider.  Return to your normal activities as told by your health care provider. Ask your health care provider what activities are safe for you. General instructions  Take over-the-counter and prescription medicines only as told by your health care provider.  Drink enough fluid to keep your urine pale yellow.  Keep all follow-up visits as told by your health care provider. This is important. How is this prevented?  There is no vaccine to help prevent COVID-19 infection. However, there are steps you can take to protect yourself and others from this virus. To protect yourself:   Do not travel to areas where COVID-19 is a risk. The areas where COVID-19 is reported change often. To identify high-risk areas and travel restrictions, check the CDC travel website: FatFares.com.br  If you live in, or must travel to, an area where COVID-19 is a risk, take precautions to avoid infection. ? Stay away from people who are sick. ? Wash your hands often with soap and water for 20 seconds. If soap and water are not available, use an alcohol-based hand sanitizer. ? Avoid touching your mouth, face, eyes, or nose. ? Avoid going out in public, follow guidance from your state and local health authorities. ? If you must go out in public, wear a cloth face covering or face mask. Make sure your mask covers your nose and mouth. ? Avoid crowded indoor spaces. Stay at least 6 feet (2 meters) away from others. ? Disinfect objects and surfaces that are frequently touched every day. This may include:  Counters and tables.  Doorknobs and light switches.  Sinks and faucets.  Electronics, such as phones, remote controls, keyboards, computers, and tablets. To protect others: If you have symptoms of COVID-19, take steps to prevent the virus from spreading to others.  If you think you have a COVID-19 infection, contact your health care provider right away. Tell your health care team that you think you  may have a COVID-19 infection.  Stay home. Leave your house only to seek  medical care. Do not use public transport.  Do not travel while you are sick.  Wash your hands often with soap and water for 20 seconds. If soap and water are not available, use alcohol-based hand sanitizer.  Stay away from other members of your household. Let healthy household members care for children and pets, if possible. If you have to care for children or pets, wash your hands often and wear a mask. If possible, stay in your own room, separate from others. Use a different bathroom.  Make sure that all people in your household wash their hands well and often.  Cough or sneeze into a tissue or your sleeve or elbow. Do not cough or sneeze into your hand or into the air.  Wear a cloth face covering or face mask. Make sure your mask covers your nose and mouth. Where to find more information  Centers for Disease Control and Prevention: PurpleGadgets.be  World Health Organization: https://www.castaneda.info/ Contact a health care provider if:  You live in or have traveled to an area where COVID-19 is a risk and you have symptoms of the infection.  You have had contact with someone who has COVID-19 and you have symptoms of the infection. Get help right away if:  You have trouble breathing.  You have pain or pressure in your chest.  You have confusion.  You have bluish lips and fingernails.  You have difficulty waking from sleep.  You have symptoms that get worse. These symptoms may represent a serious problem that is an emergency. Do not wait to see if the symptoms will go away. Get medical help right away. Call your local emergency services (911 in the [place] Do not drive yourself to the hospital. Let the emergency medical personnel know if you think you have COVID-19. Summary  COVID-19 is a respiratory infection that is caused by a virus. It is also known as  coronavirus disease or novel coronavirus. It can cause serious infections, such as pneumonia, acute respiratory distress syndrome, acute respiratory failure, or sepsis.  The virus that causes COVID-19 is contagious. This means that it can spread from person to person through droplets from breathing, speaking, singing, coughing, or sneezing.  You are more likely to develop a serious illness if you are 48 years of age or older, have a weak immune system, live in a nursing home, or have chronic disease.  There is no medicine to treat COVID-19. Your health care provider will talk with you about ways to treat your symptoms.  Take steps to protect yourself and others from infection. Wash your hands often and disinfect objects and surfaces that are frequently touched every day. Stay away from people who are sick and wear a mask if you are sick. This information is not intended to replace advice given to you by your health care provider. Make sure you discuss any questions you have with your health care provider. Document Revised: 06/09/2019 Document Reviewed: 09/15/2018 Elsevier Patient Education  2020 [place].  COVID-19: How to Protect Yourself and Others Know how it spreads  There is currently no vaccine to prevent coronavirus disease 2019 (COVID-19).  The best way to prevent illness is to avoid being exposed to this virus.  The virus is thought to spread mainly from person-to-person. ? Between people who are in close contact with one another (within about 6 feet). ? Through respiratory droplets produced when an infected person coughs, sneezes or talks. ? These droplets can land in the mouths or noses  of people who are nearby or possibly be inhaled into the lungs. ? COVID-19 may be spread by people who are not showing symptoms. Everyone should Clean your hands often  Wash your hands often with soap and water for at least 20 seconds especially after you have been in a public place, or  after blowing your nose, coughing, or sneezing.  If soap and water are not readily available, use a hand sanitizer that contains at least 60% alcohol. Cover all surfaces of your hands and rub them together until they feel dry.  Avoid touching your eyes, nose, and mouth with unwashed hands. Avoid close contact  Limit contact with others as much as possible.  Avoid close contact with people who are sick.  Put distance between yourself and other people. ? Remember that some people without symptoms may be able to spread virus. ? This is especially important for people who are at higher risk of getting very [employer] Cover your mouth and nose with a mask when around others  You could spread COVID-19 to others even if you do not feel sick.  Everyone should wear a mask in public settings and when around people not living in their household, especially when social distancing is difficult to maintain. ? Masks should not be placed on young children under age 89, anyone who has trouble breathing, or is unconscious, incapacitated or otherwise unable to remove the mask without assistance.  The mask is meant to protect other people in case you are infected.  Do NOT use a facemask meant for a Research scientist (physical sciences).  Continue to keep about 6 feet between yourself and others. The mask is not a substitute for social distancing. Cover coughs and sneezes  Always cover your mouth and nose with a tissue when you cough or sneeze or use the inside of your elbow.  Throw used tissues in the trash.  Immediately wash your hands with soap and water for at least 20 seconds. If soap and water are not readily available, clean your hands with a hand sanitizer that contains at least 60% alcohol. Clean and disinfect  Clean AND disinfect frequently touched surfaces daily. This includes tables, doorknobs, light switches, countertops, handles,  desks, phones, keyboards, toilets, faucets, and sinks. [email or web address]  If surfaces are dirty, clean them: Use detergent or soap and water prior to disinfection.  Then, use a household disinfectant. You can see a list of EPA-registered household disinfectants here. [email or web address] 04/26/2019 This information is not intended to replace advice given to you by your health care provider. Make sure you discuss any questions you have with your health care provider. Document Revised: 05/04/2019 Document Reviewed: 03/02/2019 Elsevier Patient Education  2020 [employer]    Person Under Monitoring Name: Molly Grant  Location: [address] [place] [address]   Infection Prevention Recommendations for Individuals Confirmed to have, or Being Evaluated for, 2019 Novel Coronavirus (COVID-19) Infection Who Receive Care at Home  Individuals who are confirmed to have, or are being evaluated for, COVID-19 should follow the prevention steps below until a healthcare provider or local or state health department says they can return to normal activities.  Stay home except to get medical care You should restrict activities outside your home, except for getting medical care. Do not go to work, school, or public areas, and do not use public transportation or taxis.  Call ahead before visiting your doctor Before your medical appointment, call the healthcare provider and tell them that you have, or are  being evaluated for, COVID-19 infection. This will help the healthcare provider's office take steps to keep other people from getting infected. Ask your healthcare provider to call the local or state health department.  Monitor your symptoms Seek prompt medical attention if your illness is worsening (e.g., difficulty breathing). Before going to your medical appointment, call the healthcare provider and tell them that you  have, or are being evaluated for, COVID-19 infection. Ask your healthcare provider to call the local or state health department.  Wear a facemask You should wear a facemask that covers your nose and mouth when you are in the same room with other people and when you visit a healthcare provider. People who live with or visit you should also wear a facemask while they are in the same room with you.  Separate yourself from other people in your home As much as possible, you should stay in a different room from other people in your home. Also, you should use a separate bathroom, if available.  Avoid sharing household items You should not share dishes, drinking glasses, cups, eating utensils, towels, bedding, or other items with other people in your home. After using these items, you should wash them thoroughly with soap and water.  Cover your coughs and sneezes Cover your mouth and nose with a tissue when you cough or sneeze, or you can cough or sneeze into your sleeve. Throw used tissues in a lined trash can, and immediately wash your hands with soap and water for at least 20 seconds or use an alcohol-based hand rub.  Wash your [employer] your hands often and thoroughly with soap and water for at least 20 seconds. You can use an alcohol-based hand sanitizer if soap and water are not available and if your hands are not visibly dirty. Avoid touching your eyes, nose, and mouth with unwashed hands.   Prevention Steps for Caregivers and Household Members of Individuals Confirmed to have, or Being Evaluated for, COVID-19 Infection Being Cared for in the Home  If you live with, or provide care at home for, a person confirmed to have, or being evaluated for, COVID-19 infection please follow these guidelines to prevent infection:  Follow healthcare provider's instructions Make sure that you understand and can help the patient follow any healthcare provider instructions for all care.  Provide  for the patient's basic needs You should help the patient with basic needs in the home and provide support for getting groceries, prescriptions, and other personal needs.  Monitor the patient's symptoms If they are getting sicker, call his or her medical provider and tell them that the patient has, or is being evaluated for, COVID-19 infection. This will help the healthcare provider's office take steps to keep other people from getting infected. Ask the healthcare provider to call the local or state health department.  Limit the number of people who have contact with the patient  If possible, have only one caregiver for the patient.  Other household members should stay in another home or place of residence. If this is not possible, they should stay  in another room, or be separated from the patient as much as possible. Use a separate bathroom, if available.  Restrict visitors who do not have an essential need to be in the home.  Keep older adults, very young children, and other sick people away from the patient Keep older adults, very young children, and those who have compromised immune systems or chronic health conditions away from the patient. This  includes people with chronic heart, lung, or kidney conditions, diabetes, and cancer.  Ensure good ventilation Make sure that shared spaces in the home have good air flow, such as from an air conditioner or an opened window, weather permitting.  Wash your hands often  Wash your hands often and thoroughly with soap and water for at least 20 seconds. You can use an alcohol based hand sanitizer if soap and water are not available and if your hands are not visibly dirty.  Avoid touching your eyes, nose, and mouth with unwashed hands.  Use disposable paper towels to dry your hands. If not available, use dedicated cloth towels and replace them when they become wet.  Wear a facemask and gloves  Wear a disposable facemask at all times in the  room and gloves when you touch or have contact with the patient's blood, body fluids, and/or secretions or excretions, such as sweat, saliva, sputum, nasal mucus, vomit, urine, or feces.  Ensure the mask fits over your nose and mouth tightly, and do not touch it during use.  Throw out disposable facemasks and gloves after using them. Do not reuse.  Wash your hands immediately after removing your facemask and gloves.  If your personal clothing becomes contaminated, carefully remove clothing and launder. Wash your hands after handling contaminated clothing.  Place all used disposable facemasks, gloves, and other waste in a lined container before disposing them with other household waste.  Remove gloves and wash your hands immediately after handling these items.  Do not share dishes, glasses, or other household items with the patient  Avoid sharing household items. You should not share dishes, drinking glasses, cups, eating utensils, towels, bedding, or other items with a patient who is confirmed to have, or being evaluated for, COVID-19 infection.  After the person uses these items, you should wash them thoroughly with soap and water.  Wash laundry thoroughly  Immediately remove and wash clothes or bedding that have blood, body fluids, and/or secretions or excretions, such as sweat, saliva, sputum, nasal mucus, vomit, urine, or feces, on them.  Wear gloves when handling laundry from the patient.  Read and follow directions on labels of laundry or clothing items and detergent. In general, wash and dry with the warmest temperatures recommended on the label.  Clean all areas the individual has used often  Clean all touchable surfaces, such as counters, tabletops, doorknobs, bathroom fixtures, toilets, phones, keyboards, tablets, and bedside tables, every day. Also, clean any surfaces that may have blood, body fluids, and/or secretions or excretions on them.  Wear gloves when cleaning  surfaces the patient has come in contact with.  Use a diluted bleach solution (e.g., dilute bleach with 1 part bleach and 10 parts water) or a household disinfectant with a label that says EPA-registered for coronaviruses. To make a bleach solution at home, add 1 tablespoon of bleach to 1 quart (4 cups) of water. For a larger supply, add  cup of bleach to 1 gallon (16 cups) of water.  Read labels of cleaning products and follow recommendations provided on product labels. Labels contain instructions for safe and effective use of the cleaning product including precautions you should take when applying the product, such as wearing gloves or eye protection and making sure you have good ventilation during use of the product.  Remove gloves and wash hands immediately after cleaning.  Monitor yourself for signs and symptoms of illness Caregivers and household members are considered close contacts, should monitor their health, and  will be asked to limit movement outside of the home to the extent possible. Follow the monitoring steps for close contacts listed on the symptom monitoring form.   ? If you have additional questions, contact your local health department or call the epidemiologist on call at [phone] (available 24/7). ? This guidance is subject to change. For the most up-to-date guidance from [facility], please refer to their website: [email or web address]

## 2020-07-22 ENCOUNTER — Other Ambulatory Visit: Payer: Self-pay | Admitting: Family

## 2020-07-22 DIAGNOSIS — Z1231 Encounter for screening mammogram for malignant neoplasm of breast: Secondary | ICD-10-CM

## 2021-05-01 ENCOUNTER — Other Ambulatory Visit: Payer: Self-pay | Admitting: Family Medicine

## 2021-05-01 DIAGNOSIS — Z13820 Encounter for screening for osteoporosis: Secondary | ICD-10-CM

## 2021-06-05 ENCOUNTER — Other Ambulatory Visit: Payer: Self-pay

## 2021-06-05 ENCOUNTER — Ambulatory Visit
Admission: RE | Admit: 2021-06-05 | Discharge: 2021-06-05 | Disposition: A | Discharge status: Home / Self Care | Payer: Medicare HMO | Source: Ambulatory Visit | Attending: Family Medicine | Admitting: Family Medicine

## 2021-06-05 DIAGNOSIS — Z13820 Encounter for screening for osteoporosis: Secondary | ICD-10-CM
